# Patient Record
Sex: Female | Born: 1985 | Race: White | Hispanic: No | Marital: Single | Smoking: Former smoker
Health system: Southern US, Community
[De-identification: ages and names within clinical notes are randomized; demographics above are authoritative.]

## PROBLEM LIST (undated history)

## (undated) ENCOUNTER — Ambulatory Visit

## (undated) DIAGNOSIS — I219 Acute myocardial infarction, unspecified: Secondary | ICD-10-CM

## (undated) DIAGNOSIS — I2510 Atherosclerotic heart disease of native coronary artery without angina pectoris: Secondary | ICD-10-CM

## (undated) DIAGNOSIS — I10 Essential (primary) hypertension: Secondary | ICD-10-CM

## (undated) DIAGNOSIS — E119 Type 2 diabetes mellitus without complications: Secondary | ICD-10-CM

## (undated) DIAGNOSIS — E785 Hyperlipidemia, unspecified: Secondary | ICD-10-CM

## (undated) HISTORY — DX: Acute myocardial infarction, unspecified: I21.9

## (undated) HISTORY — PX: CORONARY STENT PLACEMENT: SHX1402

---

## 2004-02-10 ENCOUNTER — Emergency Department (HOSPITAL_COMMUNITY): Admission: EM | Admit: 2004-02-10 | Discharge: 2004-02-11 | Payer: Self-pay | Admitting: *Deleted

## 2006-08-11 ENCOUNTER — Emergency Department (HOSPITAL_COMMUNITY): Admission: EM | Admit: 2006-08-11 | Discharge: 2006-08-11 | Payer: Self-pay | Admitting: Emergency Medicine

## 2006-11-20 ENCOUNTER — Emergency Department (HOSPITAL_COMMUNITY): Admission: EM | Admit: 2006-11-20 | Discharge: 2006-11-21 | Payer: Self-pay | Admitting: Emergency Medicine

## 2007-09-09 ENCOUNTER — Emergency Department (HOSPITAL_COMMUNITY): Admission: EM | Admit: 2007-09-09 | Discharge: 2007-09-09 | Payer: Self-pay | Admitting: Emergency Medicine

## 2008-02-03 ENCOUNTER — Emergency Department (HOSPITAL_COMMUNITY): Admission: EM | Admit: 2008-02-03 | Discharge: 2008-02-03 | Payer: Self-pay | Admitting: Emergency Medicine

## 2008-08-25 ENCOUNTER — Emergency Department (HOSPITAL_COMMUNITY): Admission: EM | Admit: 2008-08-25 | Discharge: 2008-08-25 | Payer: Self-pay | Admitting: Emergency Medicine

## 2009-01-28 ENCOUNTER — Emergency Department (HOSPITAL_COMMUNITY): Admission: EM | Admit: 2009-01-28 | Discharge: 2009-01-29 | Payer: Self-pay | Admitting: Emergency Medicine

## 2009-03-09 ENCOUNTER — Emergency Department (HOSPITAL_COMMUNITY): Admission: EM | Admit: 2009-03-09 | Discharge: 2009-03-09 | Payer: Self-pay | Admitting: Emergency Medicine

## 2009-05-14 ENCOUNTER — Emergency Department (HOSPITAL_COMMUNITY): Admission: EM | Admit: 2009-05-14 | Discharge: 2009-05-14 | Payer: Self-pay | Admitting: Emergency Medicine

## 2010-11-26 LAB — DIFFERENTIAL
Basophils Relative: 0 % (ref 0–1)
Lymphs Abs: 3 10*3/uL (ref 0.7–4.0)
Monocytes Relative: 5 % (ref 3–12)
Neutro Abs: 7.7 10*3/uL (ref 1.7–7.7)
Neutrophils Relative %: 67 % (ref 43–77)

## 2010-11-26 LAB — CBC
HCT: 44.4 % (ref 36.0–46.0)
Hemoglobin: 15 g/dL (ref 12.0–15.0)
MCHC: 33.8 g/dL (ref 30.0–36.0)
RDW: 13.3 % (ref 11.5–15.5)

## 2010-11-26 LAB — COMPREHENSIVE METABOLIC PANEL
BUN: 7 mg/dL (ref 6–23)
Calcium: 9 mg/dL (ref 8.4–10.5)
Glucose, Bld: 97 mg/dL (ref 70–99)
Total Protein: 7.2 g/dL (ref 6.0–8.3)

## 2011-05-19 LAB — URINALYSIS, ROUTINE W REFLEX MICROSCOPIC
Bilirubin Urine: NEGATIVE
Ketones, ur: NEGATIVE
Nitrite: NEGATIVE
Protein, ur: NEGATIVE
Urobilinogen, UA: 0.2

## 2011-05-19 LAB — COMPREHENSIVE METABOLIC PANEL
ALT: 15
Alkaline Phosphatase: 100
CO2: 23
Calcium: 9.2
Chloride: 110
GFR calc non Af Amer: >60
Glucose, Bld: 92
Sodium: 137
Total Bilirubin: 0.6

## 2011-05-19 LAB — PREGNANCY, URINE: Preg Test, Ur: NEGATIVE

## 2011-05-19 LAB — CBC
Hemoglobin: 14.7
MCHC: 33.9
RBC: 5.05

## 2011-05-19 LAB — URINE CULTURE

## 2011-05-19 LAB — DIFFERENTIAL
Basophils Absolute: 0
Basophils Relative: 0
Eosinophils Absolute: 0.1
Lymphs Abs: 2.7
Neutrophils Relative %: 69

## 2011-05-19 LAB — URINE MICROSCOPIC-ADD ON

## 2011-05-19 LAB — LIPASE, BLOOD: Lipase: 29

## 2012-11-12 DIAGNOSIS — E669 Obesity, unspecified: Secondary | ICD-10-CM | POA: Insufficient documentation

## 2013-09-30 DIAGNOSIS — Z720 Tobacco use: Secondary | ICD-10-CM | POA: Insufficient documentation

## 2015-03-10 DIAGNOSIS — N912 Amenorrhea, unspecified: Secondary | ICD-10-CM | POA: Insufficient documentation

## 2015-07-28 DIAGNOSIS — I10 Essential (primary) hypertension: Secondary | ICD-10-CM | POA: Insufficient documentation

## 2015-08-22 ENCOUNTER — Emergency Department (HOSPITAL_COMMUNITY)
Admission: EM | Admit: 2015-08-22 | Discharge: 2015-08-22 | Disposition: A | Discharge status: Home / Self Care | Payer: 59 | Attending: Emergency Medicine | Admitting: Emergency Medicine

## 2015-08-22 ENCOUNTER — Encounter (HOSPITAL_COMMUNITY): Payer: Self-pay | Admitting: Emergency Medicine

## 2015-08-22 DIAGNOSIS — Y9289 Other specified places as the place of occurrence of the external cause: Secondary | ICD-10-CM | POA: Insufficient documentation

## 2015-08-22 DIAGNOSIS — Z9861 Coronary angioplasty status: Secondary | ICD-10-CM | POA: Insufficient documentation

## 2015-08-22 DIAGNOSIS — Y998 Other external cause status: Secondary | ICD-10-CM | POA: Insufficient documentation

## 2015-08-22 DIAGNOSIS — H5711 Ocular pain, right eye: Secondary | ICD-10-CM | POA: Diagnosis present

## 2015-08-22 DIAGNOSIS — X58XXXA Exposure to other specified factors, initial encounter: Secondary | ICD-10-CM | POA: Insufficient documentation

## 2015-08-22 DIAGNOSIS — F17210 Nicotine dependence, cigarettes, uncomplicated: Secondary | ICD-10-CM | POA: Insufficient documentation

## 2015-08-22 DIAGNOSIS — I10 Essential (primary) hypertension: Secondary | ICD-10-CM | POA: Diagnosis not present

## 2015-08-22 DIAGNOSIS — S0501XA Injury of conjunctiva and corneal abrasion without foreign body, right eye, initial encounter: Secondary | ICD-10-CM | POA: Diagnosis not present

## 2015-08-22 DIAGNOSIS — Y9389 Activity, other specified: Secondary | ICD-10-CM | POA: Insufficient documentation

## 2015-08-22 HISTORY — DX: Essential (primary) hypertension: I10

## 2015-08-22 MED ORDER — TOBRAMYCIN 0.3 % OP SOLN: 2.0000 [drp] | OPHTHALMIC | Status: DC

## 2015-08-22 MED ORDER — HYDROCODONE-ACETAMINOPHEN 5-325 MG PO TABS: 2.0000 | ORAL_TABLET | ORAL | Status: DC | PRN

## 2015-08-22 NOTE — ED Notes (Signed)
Pt states that she has been having right eye redness and tearing for the past 3 days.

## 2015-08-22 NOTE — ED Provider Notes (Signed)
CSN: [id number]     Arrival date & time 08/22/15  1042 History   First MD Initiated Contact with Patient 08/22/15 1100     Chief Complaint  Patient presents with  . Eye Pain     (Consider location/radiation/quality/duration/timing/severity/associated sxs/prior Treatment) Patient is a 29 y.o. female presenting with eye problem. The history is provided by the patient. No language interpreter was used.  Eye Problem Location:  R eye Quality:  Aching Severity:  Moderate Onset quality:  Gradual Duration:  5 days Timing:  Constant Progression:  Worsening Chronicity:  New Context: not chemical exposure, not contact lens problem, not direct trauma, not foreign body and not scratch   Relieved by:  Nothing Worsened by:  Nothing tried Ineffective treatments:  None tried Associated symptoms: foreign body sensation   Associated symptoms: no blurred vision and no swelling   Pt seen at Urgent care Tuesday and had normal evaluation.  Pt reports increased redness and irritation.   Past Medical History  Diagnosis Date  . Hypertension    Past Surgical History  Procedure Laterality Date  . Coronary stent placement     History reviewed. No pertinent family history. Social History  Substance Use Topics  . Smoking status: Current Every Day Smoker -- 1.00 packs/day    Types: Cigarettes  . Smokeless tobacco: None  . Alcohol Use: No   OB History    No data available     Review of Systems  Eyes: Negative for blurred vision.  All other systems reviewed and are negative.     Allergies  Codeine  Home Medications   Prior to Admission medications   Not on File   BP 175/114 mmHg  Pulse 104  Temp(Src) 98.7 F (37.1 C) (Oral)  Resp 18  Ht 5\' 5"  (1.651 m)  Wt 133.811 kg  BMI 49.09 kg/m2  SpO2 100% Physical Exam  Constitutional: She appears well-developed and well-nourished.  HENT:  Head: Atraumatic.  Eyes: EOM are normal. Pupils are equal, round, and reactive to light.   Injected right conjunctiva, tearing during exam  Fluro,  Tiny pinpoint area of uptake,  Lids swabbed no foreign body,  No obvious eye foreign body,  Slit lamp , no ulcer, no lesions,   Cardiovascular: Normal rate.   Pulmonary/Chest: Effort normal.  Neurological: She is alert.  Skin: Skin is warm.  Psychiatric: She has a normal mood and affect.  Nursing note and vitals reviewed.   ED Course  Procedures (including critical care time) Labs Review Labs Reviewed - No data to display  Imaging Review No results found. I have personally reviewed and evaluated these images and lab results as part of my medical decision-making.   EKG Interpretation None      MDM I counseled pt on findings.  She has a tiny discolored area with a pinpoint area of uptake.  I am unsure if this is a resolving ulcer.corneal abrasion or if pt has a tiny foreign body.  I swabbed areas  under eyelids, no foreign body.  I will treat with Tobrex.  Pt advised to call Opthomology on Monday to be seen   Final diagnoses:  Corneal abrasion, right, initial encounter    Meds ordered this encounter  Medications  . citalopram (CELEXA) 10 MG tablet    Sig: Take 10 mg by mouth daily.  Marland Kitchen. spironolactone (ALDACTONE) 25 MG tablet    Sig: Take 25 mg by mouth daily.  Marland Kitchen. labetalol (NORMODYNE) 300 MG tablet    Sig: Take  300 mg by mouth 3 (three) times daily.  Marland Kitchen atorvastatin (LIPITOR) 80 MG tablet    Sig: Take 80 mg by mouth daily.  Marland Kitchen amLODipine (NORVASC) 10 MG tablet    Sig: Take 10 mg by mouth daily.  . cholecalciferol (VITAMIN D) 1000 units tablet    Sig: Take 1,000 Units by mouth daily.  Marland Kitchen lisinopril-hydrochlorothiazide (PRINZIDE,ZESTORETIC) 20-25 MG tablet    Sig: Take 1 tablet by mouth daily.  Marland Kitchen acetaminophen (TYLENOL) 500 MG tablet    Sig: Take 500 mg by mouth every 6 (six) hours as needed for mild pain.  Marland Kitchen tobramycin (TOBREX) 0.3 % ophthalmic solution    Sig: Place 2 drops into the right eye every 4 (four) hours.     Dispense:  5 mL    Refill:  0    Order Specific Question:  Supervising Provider    Answer:  MILLER, BRIAN [[id number]]  . HYDROcodone-acetaminophen (NORCO/VICODIN) 5-325 MG tablet    Sig: Take 2 tablets by mouth every 4 (four) hours as needed.    Dispense:  10 tablet    Refill:  0    Order Specific Question:  Supervising Provider    Answer:  Eber Hong [[id number]]      Lonia Skinner [place], PA-C 08/22/15 1325  Benjiman Core, MD 08/22/15 [phone]

## 2015-08-22 NOTE — Discharge Instructions (Signed)

## 2017-03-10 ENCOUNTER — Encounter: Payer: Self-pay | Admitting: Obstetrics & Gynecology

## 2017-03-10 ENCOUNTER — Ambulatory Visit (INDEPENDENT_AMBULATORY_CARE_PROVIDER_SITE_OTHER): Payer: BLUE CROSS/BLUE SHIELD | Admitting: Obstetrics & Gynecology

## 2017-03-10 VITALS — BP 202/130 | HR 103 | Ht 65.5 in | Wt 287.0 lb

## 2017-03-10 DIAGNOSIS — N970 Female infertility associated with anovulation: Secondary | ICD-10-CM | POA: Diagnosis not present

## 2017-03-10 DIAGNOSIS — E282 Polycystic ovarian syndrome: Secondary | ICD-10-CM

## 2017-03-10 DIAGNOSIS — N762 Acute vulvitis: Secondary | ICD-10-CM

## 2017-03-10 NOTE — Progress Notes (Signed)
Chief Complaint  Patient presents with  . pain with using tampons and during sex    Blood pressure (!) 202/130, pulse (!) 103, height 5' 5.5" (1.664 m), weight 287 lb (130.2 kg), last menstrual period 02/10/2017.  31 y.o. G0P0000 Patient's last menstrual period was 02/10/2017 (approximate). The current method of family planning is none.  Outpatient Encounter Medications as of 03/10/2017  Medication Sig  . [DISCONTINUED] acetaminophen (TYLENOL) 500 MG tablet Take 500 mg by mouth every 6 (six) hours as needed for mild pain.  . [DISCONTINUED] amLODipine (NORVASC) 10 MG tablet Take 10 mg by mouth daily.  . [DISCONTINUED] atorvastatin (LIPITOR) 80 MG tablet Take 80 mg by mouth daily.  . [DISCONTINUED] cholecalciferol (VITAMIN D) 1000 units tablet Take 1,000 Units by mouth daily.  . [DISCONTINUED] citalopram (CELEXA) 10 MG tablet Take 10 mg by mouth daily.  . [DISCONTINUED] HYDROcodone-acetaminophen (NORCO/VICODIN) 5-325 MG tablet Take 2 tablets by mouth every 4 (four) hours as needed.  . [DISCONTINUED] labetalol (NORMODYNE) 300 MG tablet Take 300 mg by mouth 3 (three) times daily.  . [DISCONTINUED] lisinopril-hydrochlorothiazide (PRINZIDE,ZESTORETIC) 20-25 MG tablet Take 1 tablet by mouth daily.  . [DISCONTINUED] spironolactone (ALDACTONE) 25 MG tablet Take 25 mg by mouth daily.  . [DISCONTINUED] tobramycin (TOBREX) 0.3 % ophthalmic solution Place 2 drops into the right eye every 4 (four) hours.   No facility-administered encounter medications on file as of 03/10/2017.     Subjective Pt with several month or longer history of pain inserting tampons and with sex, deep and minimal insertional Feels like burning, tearing, uses lubrication without success sometimes feels like hitting something as well No bleeding associated  Objective Blood pressure (!) 202/130, pulse (!) 103, height 5' 5.5" (1.664 m), weight 287 lb (130.2 kg), last menstrual period 02/10/2017.   General WDWN  female NAD Vulva:  normal appearing vulva with no masses, tenderness or lesions, negative Q tip test Vagina:  normal mucosa Cervix:  no cervical motion tenderness and no lesions Uterus:  normal size, contour, position, consistency, mobility, non-tender Adnexa: ovaries:,     Pertinent ROS No burning with urination, frequency or urgency No nausea, vomiting or diarrhea Nor fever chills or other constitutional symptoms   Labs or studies none    Impression Diagnoses this Encounter::   ICD-10-CM   1. Acute vulvitis N76.2   2. PCOS (polycystic ovarian syndrome) E28.2   3. Anovulation N97.0     Established relevant diagnosis(es):   Plan/Recommendations: No orders of the defined types were placed in this encounter.   Labs or Scans Ordered: No orders of the defined types were placed in this encounter.   Management:: Consider topical anesthetic vs steroids,vestibulectomy would not be of benefit  Follow up Return if symptoms worsen or fail to improve.        All questions were answered.  Past Medical History:  Diagnosis Date  . 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 Hypertension     Past Surgical History:  Procedure Laterality Date  . CORONARY STENT PLACEMENT      OB History    Gravida Para Term Preterm AB Living   0 0 0 0 0 0   SAB TAB Ectopic Multiple Live Births   0 0 0 0 0      Allergies  Allergen Reactions  . Codeine Nausea And Vomiting  . Hydrocodone Nausea Only    Social History   Socioeconomic History  . Marital status: Single  Spouse name: None  . Number of children: None  . Years of education: None  . Highest education level: None  Social Needs  . Financial resource strain: None  . Food insecurity - worry: None  . Food insecurity - inability: None  . Transportation needs - medical: None  . Transportation needs - non-medical: None  Occupational History  . None  Tobacco Use  . Smoking status: Current Every Day Smoker     Packs/day: 1.00    Years: 15.00    Pack years: 15.00    Types: Cigarettes  . Smokeless tobacco: Never Used  Substance and Sexual Activity  . Alcohol use: No  . Drug use: No  . Sexual activity: Yes    Birth control/protection: None  Other Topics Concern  . None  Social History Narrative  . None    Family History  Problem Relation Age of Onset  . Diabetes Paternal Grandfather   . Other Paternal Grandmother        low blood sugar  . Other Maternal Grandmother        gallstones-septic  . Other Maternal Grandfather        heart issues  . Hypertension Father   . Heart disease Father   . Diabetes Father   . Hypertension Mother   . Cancer Mother        ovarian  . Diabetes Brother        borderline

## 2017-07-19 ENCOUNTER — Encounter: Payer: Self-pay | Admitting: Obstetrics & Gynecology

## 2017-07-24 ENCOUNTER — Ambulatory Visit (INDEPENDENT_AMBULATORY_CARE_PROVIDER_SITE_OTHER): Payer: Self-pay | Admitting: Obstetrics & Gynecology

## 2017-07-24 ENCOUNTER — Encounter: Payer: Self-pay | Admitting: Obstetrics & Gynecology

## 2017-07-24 VITALS — BP 190/110 | HR 91 | Ht 65.0 in | Wt 275.0 lb

## 2017-07-24 DIAGNOSIS — F329 Major depressive disorder, single episode, unspecified: Secondary | ICD-10-CM

## 2017-07-24 MED ORDER — FLUOXETINE HCL 20 MG PO CAPS: 20.0000 mg | ORAL_CAPSULE | Freq: Every day | ORAL | 3 refills | Status: DC

## 2017-07-24 MED ORDER — ZOLPIDEM TARTRATE 10 MG PO TABS: 10.0000 mg | ORAL_TABLET | Freq: Every evening | ORAL | 3 refills | Status: DC | PRN

## 2017-07-24 MED ORDER — ALPRAZOLAM 0.5 MG PO TABS: 0.5000 mg | ORAL_TABLET | Freq: Three times a day (TID) | ORAL | 3 refills | Status: DC | PRN

## 2017-07-24 NOTE — Progress Notes (Signed)
Subjective:   Chelsea Petty is an 31 y.o. female who presents for evaluation and treatment of depressive symptoms.  Onset approximately 3 months ago, gradually worsening since that time.  Current symptoms include depressed mood, anhedonia, insomnia, psychomotor agitation, fatigue, anxiety,.  Current treatment for depression:None Sleep problems: Severe   Early awakening:Moderate   Energy: Poor Motivation: Poor Concentration: Poor Rumination/worrying: Marked Memory: Poor Tearfulness: Moderate  Anxiety: Moderate  Panic: Absent  Overall Mood: Moderately worse  Hopelessness: Moderate Suicidal ideation: Absent  Other/Psychosocial Stressors: Lost husband in a motorcycle accident August 2018 Family history positive for depression in the patient's mother.  Previous treatment modalities employed include None.  Past episodes of depression:x 1 Organic causes of depression present: None.  Review of Systems Pertinent items are noted in HPI.   Objective:   Mental Status Examination: Posture and motor behavior: Appropriate Dress, grooming, personal hygiene: Appropriate Facial expression: Appropriate Speech: Appropriate Mood: Appropriate Coherency and relevance of thought: Appropriate Thought content: Appropriate Perceptions: Appropriate Orientation:Appropriate Attention and concentration: Appropriate Memory: : Appropriate Information: Negative Vocabulary: Appropriate Abstract reasoning: Appropriate Judgment: Appropriate    Assessment:   Experiencing the following symptoms of depression most of the day nearly every day for more than two consecutive weeks: depressed mood, loss of interests/pleasure, change in sleep, loss of energy, trouble concentrating  Depressive Disorder:Reactive Dpression Hypertension, pt forgot to take her BP meds Suicide Risk Assessment:  Suicidal intent: none Suicidal plan: none Access to means for suicide: n/a Lethality of means for suicide:   Prior suicide attempts: none Recent exposure to suicide:   Plan:   No diagnosis found.  Reviewed concept of depression as biochemical imbalance of neurotransmitters and rationale for treatment. Instructed patient to contact office or on-call physician promptly should condition worsen or any new symptoms appear and provided on-call telephone numbers.       Face to face time:  15 minutes  Greater than 50% of the visit time was spent in counseling and coordination of care with the patient.  The summary and outline of the counseling and care coordination is summarized in the note above.   All questions were answered.

## 2017-08-21 ENCOUNTER — Encounter: Payer: Self-pay | Admitting: *Deleted

## 2017-08-24 ENCOUNTER — Encounter: Payer: Self-pay | Admitting: Obstetrics & Gynecology

## 2017-08-24 ENCOUNTER — Ambulatory Visit: Payer: Self-pay | Admitting: Obstetrics & Gynecology

## 2018-05-14 ENCOUNTER — Emergency Department (HOSPITAL_COMMUNITY)
Admission: EM | Admit: 2018-05-14 | Discharge: 2018-05-14 | Disposition: A | Discharge status: Home / Self Care | Payer: Self-pay | Attending: Emergency Medicine | Admitting: Emergency Medicine

## 2018-05-14 ENCOUNTER — Encounter (HOSPITAL_COMMUNITY): Payer: Self-pay | Admitting: Emergency Medicine

## 2018-05-14 DIAGNOSIS — Z955 Presence of coronary angioplasty implant and graft: Secondary | ICD-10-CM | POA: Insufficient documentation

## 2018-05-14 DIAGNOSIS — I252 Old myocardial infarction: Secondary | ICD-10-CM | POA: Insufficient documentation

## 2018-05-14 DIAGNOSIS — J069 Acute upper respiratory infection, unspecified: Secondary | ICD-10-CM | POA: Insufficient documentation

## 2018-05-14 DIAGNOSIS — I10 Essential (primary) hypertension: Secondary | ICD-10-CM | POA: Insufficient documentation

## 2018-05-14 DIAGNOSIS — W57XXXA Bitten or stung by nonvenomous insect and other nonvenomous arthropods, initial encounter: Secondary | ICD-10-CM | POA: Insufficient documentation

## 2018-05-14 DIAGNOSIS — F17210 Nicotine dependence, cigarettes, uncomplicated: Secondary | ICD-10-CM | POA: Insufficient documentation

## 2018-05-14 DIAGNOSIS — Z79899 Other long term (current) drug therapy: Secondary | ICD-10-CM | POA: Insufficient documentation

## 2018-05-14 DIAGNOSIS — Z7982 Long term (current) use of aspirin: Secondary | ICD-10-CM | POA: Insufficient documentation

## 2018-05-14 MED ORDER — LABETALOL HCL 200 MG PO TABS
300.0000 mg | ORAL_TABLET | Freq: Once | ORAL | Status: AC
  Administered 2018-05-14: 300 mg via ORAL
  Filled 2018-05-14: qty 2

## 2018-05-14 MED ORDER — TRIAMCINOLONE ACETONIDE 0.1 % EX CREA: 1.0000 "application " | TOPICAL_CREAM | Freq: Two times a day (BID) | CUTANEOUS | 0 refills | Status: DC

## 2018-05-14 NOTE — Discharge Instructions (Addendum)
You were evaluated today for an upper respiratory infection.  Is most likely viral in nature and will run its course.  If you still have symptoms past 10 days would suggest following up with your primary care provider if you need antibiotic therapy at that point.  Your rash resembles bedbug bites.  Have prescribed you triamcinolone cream for this rash to help with the itching.  You may also take oral Benadryl as needed for your symptoms.    Your blood pressure waited during today's visit.  This is most likely because you had not taken her blood pressure medicine.  Please follow-up with your primary care provider for reevaluation.  Return to the ED if you develop any symptoms such as chest pain, shortness of breath, headache, blurred vision.  Follow-up with your primary care provider 1 week called.  Return to the ED with any new or worsening symptoms such as: Get help right away if: You have very bad or constant: Headache. Ear pain. Pain in your forehead, behind your eyes, and over your cheekbones (sinus pain). Chest pain. You have long-lasting (chronic) lung disease and any of the following: Wheezing. Long-lasting cough. Coughing up blood. A change in your usual mucus. You have a stiff neck. You have changes in your: Vision. Hearing. Thinking. Mood.

## 2018-05-14 NOTE — ED Provider Notes (Signed)
[facility]GENCY DEPARTMENT Provider Note   CSN: [id number] Arrival date & time: 05/14/18  1421   History   Chief Complaint Chief Complaint  Patient presents with  . Rash  . Cough    HPI [name] is a 32 y.o. female with a past medical history significant for hypertension, hx MI who presents for evaluation of sinus congestion and cough x2 days with rash to upper arms.  Patient's states she developed sinus pressure and pain with nasal congestion on Friday evening.  States she has had a nonproductive cough.  Cough is worse with laying down, states it feels like her sinus drainage tripped on the back of her throat cause her to cough.  Denies fever, chills, blurred vision, chest pain, shortness of breath, abdominal pain, nausea, vomiting, diarrhea, constipation, lower extremity leg swelling.  Per patient she states she has been taking Alka-Seltzer plus for cold for symptoms.  She states she has also not taken her blood pressure medicine in 1 day.  She states she supposed to be taking labetalol 300 mg 3 times daily and Lasix.  Patient admits to rash which appeared after work on upper extremities.  States this has been itchy.  Patient states she thought she was a bit by mosquitoes and has been putting Benadryl lotion on this rash.  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  HPI  Past Medical History:  Diagnosis Date  . 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 Hypertension     There are no active problems to display for this patient.   Past Surgical History:  Procedure Laterality Date  . CORONARY STENT PLACEMENT       OB History    Gravida  0   Para  0   Term  0   Preterm  0   AB  0   Living  0     SAB  0   TAB  0   Ectopic  0   Multiple  0   Live Births  0            Home Medications    Prior to Admission medications   Medication Sig Start Date End Date Taking? Authorizing Provider    acetaminophen (TYLENOL) 500 MG tablet Take 1,000 mg by mouth as needed.     [provider]  ALPRAZolam Prudy Feeler(XANAX) 0.5 MG tablet Take 1 tablet (0.5 mg total) by mouth 3 (three) times daily as needed for anxiety. 07/24/17   Lazaro ArmsEure, Luther H, MD  aspirin EC 81 MG tablet Take 81 mg by mouth daily.     [provider]  FLUoxetine (PROZAC) 20 MG capsule Take 1 capsule (20 mg total) by mouth daily. 07/24/17   Lazaro ArmsEure, Luther H, MD  furosemide (LASIX) 20 MG tablet Take 20 mg by mouth daily.  03/31/17 03/31/18  [provider]  labetalol (NORMODYNE) 300 MG tablet Take 300 mg by mouth 3 (three) times daily.  07/10/17   [provider]  triamcinolone cream (KENALOG) 0.1 % Apply 1 application topically 2 (two) times daily. 05/14/18   Deforrest Bogle A, PA-C  zolpidem (AMBIEN) 10 MG tablet Take 1 tablet (10 mg total) by mouth at bedtime as needed for sleep. 07/24/17 08/23/17  Lazaro ArmsEure, Luther H, MD    Family History Family History  Problem Relation Age of Onset  . Diabetes Paternal Grandfather   . Other Paternal Grandmother  low blood sugar  . Other Maternal Grandmother        gallstones-septic  . Other Maternal Grandfather        heart issues  . Hypertension Father   . Heart disease Father   . Diabetes Father   . Hypertension Mother   . Cancer Mother        ovarian  . Diabetes Brother        borderline    Social History Social History   Tobacco Use  . Smoking status: Current Every Day Smoker    Packs/day: 0.50    Years: 15.00    Pack years: 7.50    Types: Cigarettes  . Smokeless tobacco: Never Used  Substance Use Topics  . Alcohol use: No  . Drug use: No     Allergies   Codeine and Hydrocodone   Review of Systems Review of Systems  Constitutional: Negative for activity change, appetite change, chills, diaphoresis, fatigue and fever.  HENT: Positive for congestion, postnasal drip, rhinorrhea, sinus pressure and sinus pain. Negative for dental problem,  drooling, ear discharge, facial swelling, hearing loss, sneezing, sore throat, tinnitus, trouble swallowing and voice change.   Respiratory: Negative.   Cardiovascular: Negative.   Gastrointestinal: Negative.   Skin: Positive for rash.     Physical Exam Updated Vital Signs BP (!) 174/122   Pulse 83   Temp 98.2 F (36.8 C) (Oral)   Resp 16   Ht 5\' 5"  (1.651 m)   Wt 127 kg   LMP 05/14/2018   SpO2 97%   BMI 46.59 kg/m   Physical Exam  Constitutional: She appears well-developed and well-nourished. No distress.  HENT:  Head: Normocephalic and atraumatic.  Mouth/Throat: Oropharynx is clear and moist.  Mild tenderness to palpation over maxillary sinus.   Eyes: Pupils are equal, round, and reactive to light.  Neck: Normal range of motion.  Cardiovascular: Normal rate, regular rhythm, normal heart sounds and intact distal pulses. Exam reveals no gallop and no friction rub.  No murmur heard. Pulmonary/Chest: Effort normal and breath sounds normal. No stridor. No respiratory distress. She has no wheezes. She has no rales. She exhibits no tenderness.  Abdominal: Soft. Bowel sounds are normal. She exhibits no distension and no mass. There is no tenderness. There is no rebound and no guarding. No hernia.  Musculoskeletal: Normal range of motion.  Neurological: She is alert.  Skin: Skin is warm and dry. She is not diaphoretic.  Multiple areas papular areas of erythema to bilateral upper extremities consistent with bedbug bites.  No target lesions, no bulla, no urticaria.   Psychiatric: She has a normal mood and affect.  Nursing note and vitals reviewed.    ED Treatments / Results  Labs (all labs ordered are listed, but only abnormal results are displayed) Labs Reviewed - No data to display  EKG None  Radiology No results found.  Procedures Procedures (including critical care time)  Medications Ordered in ED Medications  labetalol (NORMODYNE) tablet 300 mg (300 mg Oral  Given 05/14/18 1507)     Initial Impression / Assessment and Plan / ED Course  I have reviewed the triage vital signs and the nursing notes vitals past medical history.  Pertinent labs & imaging results that were available during my care of the patient were reviewed by me and considered in my medical decision making (see chart for details).  Patient presents for evaluation of sinus congestion and cough as well as rash.  Feel her sinus congestion cough  is likely a viral upper respiratory infection.  She is afebrile, nonseptic, non-ill-appearing.  Discussed with patient symptomatic treatment.  Discussed not taking over-the-counter decongestants such as Alka-Seltzer which she has been taking because of her elevated blood pressure.  Recommended Coricidin HBP.   Patient's rash consistent with bug bites.  Will DC home with triamcinolone cream and she can also take oral Benadryl for pruritus.  Patient's blood pressure has been elevated during her visit.  Per patient she has not taken her blood pressure medicine in the last 24 hours as well as been taking over-the-counter decongestants.  She is is asymptomatic with no chest pain, shortness of breath, headache, blurred vision.  Did give her 1 dose of her home blood pressure medicine, labetalol with reduction in her blood pressure.  Discussed with patient that she needs follow-up with her primary care provider for reevaluation of her blood pressure.  Discussed strict return precautions with patient.  Patient voiced understanding.    Final Clinical Impressions(s) / ED Diagnoses   Final diagnoses:  Viral upper respiratory tract infection  Bedbug bite, initial encounter    ED Discharge Orders         Ordered    triamcinolone cream (KENALOG) 0.1 %  2 times daily     05/14/18 1551           Staphany Ditton A, PA-C 05/14/18 1659    Blane Ohara, MD 05/18/18 1722

## 2018-05-14 NOTE — ED Notes (Signed)
Patient given discharge instruction, verbalized understand. Patient ambulatory out of the department.  

## 2018-05-14 NOTE — ED Notes (Signed)
Pt did not take her BP medication today and has been taking OTC cold medication

## 2018-05-14 NOTE — ED Triage Notes (Signed)
Pt states since Friday she has had a rash on arms and back with a nonproductive cough.

## 2018-08-28 ENCOUNTER — Encounter (HOSPITAL_COMMUNITY): Payer: Self-pay | Admitting: Emergency Medicine

## 2018-08-28 ENCOUNTER — Other Ambulatory Visit: Payer: Self-pay

## 2018-08-28 ENCOUNTER — Emergency Department (HOSPITAL_COMMUNITY)
Admission: EM | Admit: 2018-08-28 | Discharge: 2018-08-28 | Disposition: A | Discharge status: Home / Self Care | Payer: Self-pay | Attending: Emergency Medicine | Admitting: Emergency Medicine

## 2018-08-28 DIAGNOSIS — I10 Essential (primary) hypertension: Secondary | ICD-10-CM | POA: Insufficient documentation

## 2018-08-28 DIAGNOSIS — Z7982 Long term (current) use of aspirin: Secondary | ICD-10-CM | POA: Insufficient documentation

## 2018-08-28 DIAGNOSIS — J111 Influenza due to unidentified influenza virus with other respiratory manifestations: Secondary | ICD-10-CM | POA: Insufficient documentation

## 2018-08-28 DIAGNOSIS — F17210 Nicotine dependence, cigarettes, uncomplicated: Secondary | ICD-10-CM | POA: Insufficient documentation

## 2018-08-28 DIAGNOSIS — Z79899 Other long term (current) drug therapy: Secondary | ICD-10-CM | POA: Insufficient documentation

## 2018-08-28 MED ORDER — OSELTAMIVIR PHOSPHATE 75 MG PO CAPS
75.0000 mg | ORAL_CAPSULE | Freq: Once | ORAL | Status: AC
  Administered 2018-08-28: 75 mg via ORAL
  Filled 2018-08-28: qty 1

## 2018-08-28 MED ORDER — LORATADINE-PSEUDOEPHEDRINE ER 5-120 MG PO TB12: 1.0000 | ORAL_TABLET | Freq: Two times a day (BID) | ORAL | 0 refills | Status: DC

## 2018-08-28 MED ORDER — IBUPROFEN 800 MG PO TABS
800.0000 mg | ORAL_TABLET | Freq: Once | ORAL | Status: AC
  Administered 2018-08-28: 800 mg via ORAL
  Filled 2018-08-28: qty 1

## 2018-08-28 MED ORDER — OSELTAMIVIR PHOSPHATE 75 MG PO CAPS: 75.0000 mg | ORAL_CAPSULE | Freq: Two times a day (BID) | ORAL | 0 refills | Status: DC

## 2018-08-28 MED ORDER — PSEUDOEPHEDRINE HCL 60 MG PO TABS
60.0000 mg | ORAL_TABLET | Freq: Once | ORAL | Status: AC
  Administered 2018-08-28: 60 mg via ORAL
  Filled 2018-08-28: qty 1

## 2018-08-28 NOTE — Discharge Instructions (Addendum)
Your examination is consistent with influenza.  Please wash hands frequently.  Please increase fluids.  Use Tylenol every 4 hours or ibuprofen every 6 hours for fever, and/or aching.  Use a mask until symptoms have completely resolved.  Please keep your distance from others.  Use Tamiflu daily until all taken.  Please rest as much as possible.  Please see your primary physician or return to the emergency department if any changes in your condition, problems, or concerns.  Your blood pressure is elevated.  Please see your primary physician, or see the physicians at the [facility] clinic to establish a primary physician and for follow-up of your blood pressure.

## 2018-08-28 NOTE — ED Triage Notes (Signed)
Patient states fever and body aches that started 2 days ago. NAD.

## 2018-08-28 NOTE — ED Provider Notes (Signed)
Planes of non-productive cough, companied by diffuse myalgias and headache.  She denies any shortness of breath.  Other associated symptoms include subjective fever.  She treated herself with Tylenol yesterday with improvement of symptoms.  On exam alert and in no distress lungs clear to auscultation heart tachycardic regular rhythm abdomen obese, nontender all 4 extremities are redness swelling or tenderness neurovascular intact. I counseled patient for 5 minutes on smoking cessation   Doug Sou, MD 08/28/18 1224

## 2018-08-28 NOTE — ED Notes (Signed)
Pt states achy all over. Pain started yesterday. Cough started 2 days ago

## 2018-08-28 NOTE — ED Provider Notes (Signed)
[facility] EMERGENCY DEPARTMENT Provider Note   CSN: [id number] Arrival date & time: 08/28/18  1017     History   Chief Complaint Chief Complaint  Patient presents with  . Fever    HPI Chelsea Petty is a 33 y.o. female.  The history is provided by the patient.  Fever  Temp source:  Subjective Severity:  Moderate Onset quality:  Gradual Duration:  2 days Timing:  Constant Progression:  Worsening Chronicity:  New Relieved by:  Nothing Worsened by:  Nothing Associated symptoms: chills, congestion, cough, headaches and myalgias   Associated symptoms: no chest pain, no confusion and no dysuria   Risk factors: sick contacts   Risk factors: no contaminated food and no recent travel     Past Medical History:  Diagnosis Date  . Heart attack [facility])    has 2 stents  . Hypertension     There are no active problems to display for this patient.   Past Surgical History:  Procedure Laterality Date  . CORONARY STENT PLACEMENT       OB History    Gravida  0   Para  0   Term  0   Preterm  0   AB  0   Living  0     SAB  0   TAB  0   Ectopic  0   Multiple  0   Live Births  0            Home Medications    Prior to Admission medications   Medication Sig Start Date End Date Taking? Authorizing Provider  acetaminophen (TYLENOL) 500 MG tablet Take 1,000 mg by mouth as needed.    Yes [provider]  ALPRAZolam (XANAX) 0.5 MG tablet Take 1 tablet (0.5 mg total) by mouth 3 (three) times daily as needed for anxiety. 07/24/17  Yes Lazaro Arms, MD  aspirin EC 81 MG tablet Take 81 mg by mouth daily.    Yes [provider]  FLUoxetine (PROZAC) 20 MG capsule Take 1 capsule (20 mg total) by mouth daily. 07/24/17  Yes Lazaro Arms, MD  furosemide (LASIX) 20 MG tablet Take 20 mg by mouth daily.  03/31/17 08/28/18 Yes [provider]  labetalol (NORMODYNE) 300 MG tablet Take 300 mg by mouth 3 (three) times daily.  07/10/17  Yes  [provider]  triamcinolone cream (KENALOG) 0.1 % Apply 1 application topically 2 (two) times daily. Patient not taking: Reported on 08/28/2018 05/14/18   Henderly, Britni A, PA-C  zolpidem (AMBIEN) 10 MG tablet Take 1 tablet (10 mg total) by mouth at bedtime as needed for sleep. 07/24/17 08/23/17  Lazaro Arms, MD    Family History Family History  Problem Relation Age of Onset  . Diabetes Paternal Grandfather   . Other Paternal Grandmother        low blood sugar  . Other Maternal Grandmother        gallstones-septic  . Other Maternal Grandfather        heart issues  . Hypertension Father   . Heart disease Father   . Diabetes Father   . Hypertension Mother   . Cancer Mother        ovarian  . Diabetes Brother        borderline    Social History Social History   Tobacco Use  . Smoking status: Current Every Day Smoker    Packs/day: 0.50    Years: 15.00  Pack years: 7.50    Types: Cigarettes  . Smokeless tobacco: Never Used  Substance Use Topics  . Alcohol use: No  . Drug use: No     Allergies   Codeine and Hydrocodone   Review of Systems Review of Systems  Constitutional: Positive for chills and fever. Negative for activity change.       All ROS Neg except as noted in HPI  HENT: Positive for congestion. Negative for nosebleeds.   Eyes: Negative for photophobia and discharge.  Respiratory: Positive for cough. Negative for shortness of breath and wheezing.   Cardiovascular: Negative for chest pain and palpitations.  Gastrointestinal: Negative for abdominal pain and blood in stool.  Genitourinary: Negative for dysuria, frequency and hematuria.  Musculoskeletal: Positive for myalgias. Negative for arthralgias, back pain and neck pain.  Skin: Negative.   Neurological: Positive for headaches. Negative for dizziness, seizures and speech difficulty.  Psychiatric/Behavioral: Negative for confusion and hallucinations.     Physical Exam Updated Vital  Signs BP (!) 203/148 Comment: Hx of High Blood Pressure. Has NOT taken her BP meds this morning and took Decongestant last night  Pulse 62   Temp 99.2 F (37.3 C) (Oral)   Resp 18   Ht 5\' 5"  (1.651 m)   Wt 130.2 kg   LMP 07/28/2018   SpO2 97%   BMI 47.76 kg/m   Physical Exam Vitals signs and nursing note reviewed.  Constitutional:      Appearance: She is well-developed. She is not toxic-appearing.  HENT:     Head: Normocephalic.     Right Ear: Tympanic membrane and external ear normal.     Left Ear: Tympanic membrane and external ear normal.     Nose: Congestion present.  Eyes:     General: Lids are normal.     Pupils: Pupils are equal, round, and reactive to light.  Neck:     Musculoskeletal: Normal range of motion and neck supple.     Vascular: No carotid bruit.  Cardiovascular:     Rate and Rhythm: Regular rhythm. Tachycardia present.     Pulses: Normal pulses.     Heart sounds: Normal heart sounds.  Pulmonary:     Effort: No respiratory distress.     Breath sounds: Normal breath sounds.  Abdominal:     General: Bowel sounds are normal.     Palpations: Abdomen is soft.     Tenderness: There is no abdominal tenderness. There is no guarding.  Musculoskeletal: Normal range of motion.  Lymphadenopathy:     Head:     Right side of head: No submandibular adenopathy.     Left side of head: No submandibular adenopathy.     Cervical: No cervical adenopathy.  Skin:    General: Skin is warm and dry.     Findings: No rash.  Neurological:     Mental Status: She is alert and oriented to person, place, and time.     Cranial Nerves: No cranial nerve deficit.     Sensory: No sensory deficit.  Psychiatric:        Speech: Speech normal.      ED Treatments / Results  Labs (all labs ordered are listed, but only abnormal results are displayed) Labs Reviewed - No data to display  EKG None  Radiology No results found.  Procedures Procedures (including critical care  time)  Medications Ordered in ED Medications - No data to display   Initial Impression / Assessment and Plan / ED Course  I have reviewed the triage vital signs and the nursing notes.  Pertinent labs & imaging results that were available during my care of the patient were reviewed by me and considered in my medical decision making (see chart for details).       Final Clinical Impressions(s) / ED Diagnoses MDM Blood pressure is elevated.  Heart rate is elevated at 115.  Pulse oximetry is 96% on room air.  Within normal limits by my interpretation. Pt reports she did not take b/p medications.  Encouraged patient to stop smoking.  Patient to return in 3 days if fever is not resolving.  We discussed the importance of good handwashing, and good hydration.  We also discussed the contagious nature of this issue.  Patient will use a mask until symptoms have resolved.  She will use Tylenol every 4 hours or ibuprofen every 6 hours for fever or aching.  Prescription for Tamiflu has been provided.  I have strongly encouraged patient to get plenty of rest.  Patient acknowledges these instructions.   Final diagnoses:  Influenza    ED Discharge Orders         Ordered    oseltamivir (TAMIFLU) 75 MG capsule  Every 12 hours     08/28/18 1254    loratadine-pseudoephedrine (CLARITIN-D 12 HOUR) 5-120 MG tablet  2 times daily     08/28/18 1259           Ivery QualeBryant, Trigo Winterbottom, PA-C 08/28/18 1305    Doug SouJacubowitz, Sam, MD 08/28/18 1520

## 2019-07-29 ENCOUNTER — Other Ambulatory Visit: Payer: Self-pay

## 2019-07-29 ENCOUNTER — Emergency Department (HOSPITAL_COMMUNITY)
Admission: EM | Admit: 2019-07-29 | Discharge: 2019-07-29 | Disposition: A | Discharge status: Home / Self Care | Payer: BC Managed Care – PPO | Attending: Emergency Medicine | Admitting: Emergency Medicine

## 2019-07-29 ENCOUNTER — Encounter (HOSPITAL_COMMUNITY): Payer: Self-pay

## 2019-07-29 DIAGNOSIS — U071 COVID-19: Secondary | ICD-10-CM | POA: Insufficient documentation

## 2019-07-29 DIAGNOSIS — R05 Cough: Secondary | ICD-10-CM | POA: Diagnosis not present

## 2019-07-29 DIAGNOSIS — Z5321 Procedure and treatment not carried out due to patient leaving prior to being seen by health care provider: Secondary | ICD-10-CM | POA: Insufficient documentation

## 2019-07-29 NOTE — ED Triage Notes (Signed)
Pt presents to ED with complaints of cough. Pt tested positive for Covid on Tuesday and was given inhaler and prednisone. Pt states her cough has gotten worse since Tuesday.

## 2020-02-11 ENCOUNTER — Encounter (HOSPITAL_BASED_OUTPATIENT_CLINIC_OR_DEPARTMENT_OTHER): Payer: Self-pay

## 2020-02-11 ENCOUNTER — Other Ambulatory Visit: Payer: Self-pay

## 2020-02-11 ENCOUNTER — Emergency Department (HOSPITAL_BASED_OUTPATIENT_CLINIC_OR_DEPARTMENT_OTHER)
Admission: EM | Admit: 2020-02-11 | Discharge: 2020-02-11 | Disposition: A | Discharge status: Home / Self Care | Payer: BC Managed Care – PPO | Attending: Emergency Medicine | Admitting: Emergency Medicine

## 2020-02-11 ENCOUNTER — Emergency Department (HOSPITAL_BASED_OUTPATIENT_CLINIC_OR_DEPARTMENT_OTHER): Payer: BC Managed Care – PPO

## 2020-02-11 DIAGNOSIS — S0990XA Unspecified injury of head, initial encounter: Secondary | ICD-10-CM | POA: Diagnosis present

## 2020-02-11 DIAGNOSIS — S060X0A Concussion without loss of consciousness, initial encounter: Secondary | ICD-10-CM | POA: Diagnosis not present

## 2020-02-11 DIAGNOSIS — Y990 Civilian activity done for income or pay: Secondary | ICD-10-CM | POA: Insufficient documentation

## 2020-02-11 DIAGNOSIS — Z7982 Long term (current) use of aspirin: Secondary | ICD-10-CM | POA: Insufficient documentation

## 2020-02-11 DIAGNOSIS — Z8679 Personal history of other diseases of the circulatory system: Secondary | ICD-10-CM | POA: Insufficient documentation

## 2020-02-11 DIAGNOSIS — I10 Essential (primary) hypertension: Secondary | ICD-10-CM | POA: Insufficient documentation

## 2020-02-11 DIAGNOSIS — Y9389 Activity, other specified: Secondary | ICD-10-CM | POA: Insufficient documentation

## 2020-02-11 DIAGNOSIS — Y92410 Unspecified street and highway as the place of occurrence of the external cause: Secondary | ICD-10-CM | POA: Diagnosis not present

## 2020-02-11 DIAGNOSIS — F17210 Nicotine dependence, cigarettes, uncomplicated: Secondary | ICD-10-CM | POA: Diagnosis not present

## 2020-02-11 DIAGNOSIS — Z955 Presence of coronary angioplasty implant and graft: Secondary | ICD-10-CM | POA: Diagnosis not present

## 2020-02-11 MED ORDER — KETOROLAC TROMETHAMINE 60 MG/2ML IM SOLN
60.0000 mg | Freq: Once | INTRAMUSCULAR | Status: AC
  Administered 2020-02-11: 60 mg via INTRAMUSCULAR
  Filled 2020-02-11: qty 2

## 2020-02-11 MED ORDER — LABETALOL HCL 300 MG PO TABS
300.0000 mg | ORAL_TABLET | Freq: Three times a day (TID) | ORAL | Status: DC
  Filled 2020-02-11: qty 1
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
## 2020-02-11 NOTE — ED Notes (Signed)
ED Provider at bedside. 
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
## 2020-02-11 NOTE — Discharge Instructions (Addendum)
To follow-up with your primary care doctor about the CT scan findings.  There are possible chronic vascular changes due to plaques in your vessels which may require further follow-up MRI imaging.  Your symptoms are likely due to a concussion, please make sure to rest, take ibuprofen Tylenol for pain.  Please follow-up with the [facility] sports medicine if your concussion symptoms not improved.  Please make sure to take your blood pressure medication consistently, and resume taking aspirin given your history of

## 2020-02-12 ENCOUNTER — Telehealth: Payer: Self-pay | Admitting: Family Medicine
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
## 2020-02-12 NOTE — Telephone Encounter (Signed)
Called pt and scheduled her for tomorrow at 2 pm for new pt concussion visit.

## 2020-02-13 ENCOUNTER — Encounter: Payer: Self-pay | Admitting: Family Medicine

## 2020-02-13 ENCOUNTER — Other Ambulatory Visit: Payer: Self-pay

## 2020-02-13 ENCOUNTER — Ambulatory Visit: Payer: BC Managed Care – PPO | Admitting: Family Medicine

## 2020-02-13 VITALS — BP 190/110 | HR 91 | Ht 64.0 in | Wt 269.6 lb

## 2020-02-13 DIAGNOSIS — I10 Essential (primary) hypertension: Secondary | ICD-10-CM | POA: Diagnosis not present

## 2020-02-13 DIAGNOSIS — S060X0A Concussion without loss of consciousness, initial encounter: Secondary | ICD-10-CM | POA: Diagnosis not present

## 2020-02-13 LAB — CBC
HCT: 44 % (ref 36.0–46.0)
Hemoglobin: 14.1 g/dL (ref 12.0–15.0)
MCHC: 32.1 g/dL (ref 30.0–36.0)
MCV: 82 fl (ref 78.0–100.0)
Platelets: 234 10*3/uL (ref 150.0–400.0)
RBC: 5.36 Mil/uL — ABNORMAL HIGH (ref 3.87–5.11)
RDW: 16.9 % — ABNORMAL HIGH (ref 11.5–15.5)
WBC: 12.4 10*3/uL — ABNORMAL HIGH (ref 4.0–10.5)

## 2020-02-13 LAB — TSH: TSH: 2.16 u[IU]/mL (ref 0.35–4.50)

## 2020-02-13 MED ORDER — FLUOXETINE HCL 20 MG PO CAPS: 20.0000 mg | ORAL_CAPSULE | Freq: Every day | ORAL | 3 refills | Status: AC

## 2020-02-13 MED ORDER — LABETALOL HCL 300 MG PO TABS: 300.0000 mg | ORAL_TABLET | Freq: Three times a day (TID) | ORAL | 3 refills | Status: DC

## 2020-02-13 NOTE — Patient Instructions (Addendum)
Thank you for coming in today.  I will send notes to Dr Margo Aye in [place].   Restart labetalol and Prozac.  Get labs today.   Recheck with me in 2 weeks (about)  Advance activity as tolerated.

## 2020-02-13 NOTE — Progress Notes (Signed)
Note duplication error 
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
## 2020-02-14 LAB — COMPREHENSIVE METABOLIC PANEL
ALT: 11 U/L (ref 0–35)
AST: 14 U/L (ref 0–37)
Albumin: 3.9 g/dL (ref 3.5–5.2)
Alkaline Phosphatase: 93 U/L (ref 39–117)
BUN: 11 mg/dL (ref 6–23)
CO2: 24 mEq/L (ref 19–32)
Calcium: 9 mg/dL (ref 8.4–10.5)
Chloride: 103 mEq/L (ref 96–112)
Creatinine, Ser: 0.77 mg/dL (ref 0.40–1.20)
GFR: 84.91 mL/min (ref >60.00)
Glucose, Bld: 138 mg/dL — ABNORMAL HIGH (ref 70–99)
Potassium: 4.3 mEq/L (ref 3.5–5.1)
Sodium: 137 mEq/L (ref 135–145)
Total Bilirubin: 0.4 mg/dL (ref 0.2–1.2)
Total Protein: 7.3 g/dL (ref 6.0–8.3)

## 2020-02-14 LAB — LDL CHOLESTEROL, DIRECT: Direct LDL: 156 mg/dL

## 2020-02-14 NOTE — Progress Notes (Signed)
Kidney function looks okay.  Blood sugar is mildly elevated.Cholesterol is elevated.  PCP should probably start cholesterol-lowering medication. Thyroid test look normal.No anemia.

## 2020-02-24 ENCOUNTER — Other Ambulatory Visit: Payer: Self-pay

## 2020-02-24 ENCOUNTER — Inpatient Hospital Stay (HOSPITAL_COMMUNITY)
Admission: EM | Admit: 2020-02-24 | Discharge: 2020-02-28 | DRG: 304 | Disposition: A | Discharge status: Home / Self Care | Payer: BC Managed Care – PPO | Attending: Internal Medicine | Admitting: Internal Medicine

## 2020-02-24 ENCOUNTER — Emergency Department (HOSPITAL_COMMUNITY): Payer: BC Managed Care – PPO

## 2020-02-24 ENCOUNTER — Encounter (HOSPITAL_COMMUNITY): Payer: Self-pay

## 2020-02-24 DIAGNOSIS — Z8249 Family history of ischemic heart disease and other diseases of the circulatory system: Secondary | ICD-10-CM

## 2020-02-24 DIAGNOSIS — I10 Essential (primary) hypertension: Secondary | ICD-10-CM

## 2020-02-24 DIAGNOSIS — Z885 Allergy status to narcotic agent status: Secondary | ICD-10-CM

## 2020-02-24 DIAGNOSIS — I340 Nonrheumatic mitral (valve) insufficiency: Secondary | ICD-10-CM | POA: Diagnosis present

## 2020-02-24 DIAGNOSIS — I161 Hypertensive emergency: Secondary | ICD-10-CM | POA: Diagnosis not present

## 2020-02-24 DIAGNOSIS — Z9114 Patient's other noncompliance with medication regimen: Secondary | ICD-10-CM

## 2020-02-24 DIAGNOSIS — E876 Hypokalemia: Secondary | ICD-10-CM | POA: Diagnosis present

## 2020-02-24 DIAGNOSIS — R519 Headache, unspecified: Secondary | ICD-10-CM | POA: Diagnosis not present

## 2020-02-24 DIAGNOSIS — Z833 Family history of diabetes mellitus: Secondary | ICD-10-CM

## 2020-02-24 DIAGNOSIS — F17210 Nicotine dependence, cigarettes, uncomplicated: Secondary | ICD-10-CM | POA: Diagnosis present

## 2020-02-24 DIAGNOSIS — E782 Mixed hyperlipidemia: Secondary | ICD-10-CM | POA: Diagnosis present

## 2020-02-24 DIAGNOSIS — I248 Other forms of acute ischemic heart disease: Secondary | ICD-10-CM | POA: Diagnosis present

## 2020-02-24 DIAGNOSIS — R778 Other specified abnormalities of plasma proteins: Secondary | ICD-10-CM

## 2020-02-24 DIAGNOSIS — Z955 Presence of coronary angioplasty implant and graft: Secondary | ICD-10-CM

## 2020-02-24 DIAGNOSIS — I252 Old myocardial infarction: Secondary | ICD-10-CM

## 2020-02-24 DIAGNOSIS — I110 Hypertensive heart disease with heart failure: Secondary | ICD-10-CM | POA: Diagnosis present

## 2020-02-24 DIAGNOSIS — I5033 Acute on chronic diastolic (congestive) heart failure: Secondary | ICD-10-CM | POA: Diagnosis present

## 2020-02-24 DIAGNOSIS — J810 Acute pulmonary edema: Secondary | ICD-10-CM

## 2020-02-24 DIAGNOSIS — R072 Precordial pain: Secondary | ICD-10-CM | POA: Diagnosis not present

## 2020-02-24 DIAGNOSIS — E669 Obesity, unspecified: Secondary | ICD-10-CM | POA: Diagnosis present

## 2020-02-24 DIAGNOSIS — I160 Hypertensive urgency: Secondary | ICD-10-CM

## 2020-02-24 DIAGNOSIS — Z87820 Personal history of traumatic brain injury: Secondary | ICD-10-CM

## 2020-02-24 DIAGNOSIS — Z20822 Contact with and (suspected) exposure to covid-19: Secondary | ICD-10-CM | POA: Diagnosis present

## 2020-02-24 DIAGNOSIS — R079 Chest pain, unspecified: Secondary | ICD-10-CM | POA: Diagnosis present

## 2020-02-24 DIAGNOSIS — Z720 Tobacco use: Secondary | ICD-10-CM | POA: Diagnosis present

## 2020-02-24 DIAGNOSIS — I2510 Atherosclerotic heart disease of native coronary artery without angina pectoris: Secondary | ICD-10-CM | POA: Diagnosis present

## 2020-02-24 DIAGNOSIS — J811 Chronic pulmonary edema: Secondary | ICD-10-CM

## 2020-02-24 DIAGNOSIS — Z6841 Body Mass Index (BMI) 40.0 and over, adult: Secondary | ICD-10-CM

## 2020-02-24 DIAGNOSIS — Z79899 Other long term (current) drug therapy: Secondary | ICD-10-CM

## 2020-02-24 HISTORY — DX: Essential (primary) hypertension: I10

## 2020-02-24 HISTORY — DX: Atherosclerotic heart disease of native coronary artery without angina pectoris: I25.10

## 2020-02-24 HISTORY — DX: Hyperlipidemia, unspecified: E78.5

## 2020-02-24 LAB — BASIC METABOLIC PANEL
Anion gap: 9 (ref 5–15)
BUN: 15 mg/dL (ref 6–20)
CO2: 24 mmol/L (ref 22–32)
Calcium: 8.8 mg/dL — ABNORMAL LOW (ref 8.9–10.3)
Chloride: 106 mmol/L (ref 98–111)
Creatinine, Ser: 0.86 mg/dL (ref 0.44–1.00)
GFR calc Af Amer: >60 mL/min (ref >60)
GFR calc non Af Amer: >60 mL/min (ref >60)
Glucose, Bld: 103 mg/dL — ABNORMAL HIGH (ref 70–99)
Potassium: 3.7 mmol/L (ref 3.5–5.1)
Sodium: 139 mmol/L (ref 135–145)

## 2020-02-24 LAB — CBC
HCT: 43.2 % (ref 36.0–46.0)
Hemoglobin: 13.4 g/dL (ref 12.0–15.0)
MCH: 26.3 pg (ref 26.0–34.0)
MCHC: 31 g/dL (ref 30.0–36.0)
MCV: 84.7 fL (ref 80.0–100.0)
Platelets: 349 10*3/uL (ref 150–400)
RBC: 5.1 MIL/uL (ref 3.87–5.11)
RDW: 15.6 % — ABNORMAL HIGH (ref 11.5–15.5)
WBC: 13 10*3/uL — ABNORMAL HIGH (ref 4.0–10.5)
nRBC: 0 % (ref 0.0–0.2)

## 2020-02-24 LAB — POC URINE PREG, ED: Preg Test, Ur: NEGATIVE

## 2020-02-24 LAB — BRAIN NATRIURETIC PEPTIDE: B Natriuretic Peptide: 334 pg/mL — ABNORMAL HIGH (ref 0.0–100.0)

## 2020-02-24 LAB — SARS CORONAVIRUS 2 BY RT PCR (HOSPITAL ORDER, PERFORMED IN ~~LOC~~ HOSPITAL LAB): SARS Coronavirus 2: NEGATIVE

## 2020-02-24 LAB — TROPONIN I (HIGH SENSITIVITY)
Troponin I (High Sensitivity): 15 ng/L (ref <18)
Troponin I (High Sensitivity): 27 ng/L — ABNORMAL HIGH (ref <18)

## 2020-02-24 LAB — PROTIME-INR
INR: 1.1 (ref 0.8–1.2)
Prothrombin Time: 13.5 seconds (ref 11.4–15.2)

## 2020-02-24 MED ORDER — NICOTINE 21 MG/24HR TD PT24
21.0000 mg | MEDICATED_PATCH | Freq: Every day | TRANSDERMAL | Status: DC
  Administered 2020-02-24 – 2020-02-28 (×5): 21 mg via TRANSDERMAL
  Filled 2020-02-24 (×5): qty 1

## 2020-02-24 MED ORDER — ASPIRIN EC 81 MG PO TBEC
81.0000 mg | DELAYED_RELEASE_TABLET | Freq: Every day | ORAL | Status: AC
  Administered 2020-02-25 – 2020-02-27 (×3): 81 mg via ORAL
  Filled 2020-02-24 (×3): qty 1

## 2020-02-24 MED ORDER — FUROSEMIDE 10 MG/ML IJ SOLN
40.0000 mg | Freq: Two times a day (BID) | INTRAMUSCULAR | Status: DC
  Administered 2020-02-25 – 2020-02-26 (×4): 40 mg via INTRAVENOUS
  Filled 2020-02-24 (×5): qty 4

## 2020-02-24 MED ORDER — HEPARIN BOLUS VIA INFUSION
4000.0000 [IU] | Freq: Once | INTRAVENOUS | Status: AC
  Administered 2020-02-24: 4000 [IU] via INTRAVENOUS

## 2020-02-24 MED ORDER — FUROSEMIDE 40 MG PO TABS
40.0000 mg | ORAL_TABLET | Freq: Once | ORAL | Status: AC
  Administered 2020-02-24: 40 mg via ORAL
  Filled 2020-02-24: qty 1

## 2020-02-24 MED ORDER — NITROGLYCERIN IN D5W 200-5 MCG/ML-% IV SOLN
5.0000 ug/min | INTRAVENOUS | Status: DC
  Administered 2020-02-24: 5 ug/min via INTRAVENOUS
  Administered 2020-02-25 (×2): 75 ug/min via INTRAVENOUS
  Administered 2020-02-26: 5 ug/min via INTRAVENOUS
  Filled 2020-02-24 (×4): qty 250

## 2020-02-24 MED ORDER — ASPIRIN 81 MG PO CHEW
324.0000 mg | CHEWABLE_TABLET | Freq: Once | ORAL | Status: AC
  Administered 2020-02-24: 324 mg via ORAL
  Filled 2020-02-24: qty 4

## 2020-02-24 MED ORDER — LABETALOL HCL 200 MG PO TABS
300.0000 mg | ORAL_TABLET | Freq: Once | ORAL | Status: AC
  Administered 2020-02-24: 300 mg via ORAL
  Filled 2020-02-24: qty 2

## 2020-02-24 MED ORDER — ONDANSETRON HCL 4 MG/2ML IJ SOLN: 4.0000 mg | Freq: Four times a day (QID) | INTRAMUSCULAR | Status: DC | PRN

## 2020-02-24 MED ORDER — ACETAMINOPHEN 325 MG PO TABS
650.0000 mg | ORAL_TABLET | ORAL | Status: DC | PRN
  Administered 2020-02-24 – 2020-02-26 (×6): 650 mg via ORAL
  Filled 2020-02-24 (×6): qty 2

## 2020-02-24 MED ORDER — HEPARIN (PORCINE) 25000 UT/250ML-% IV SOLN
1450.0000 [IU]/h | INTRAVENOUS | Status: DC
  Administered 2020-02-24: 1100 [IU]/h via INTRAVENOUS
  Filled 2020-02-24: qty 250

## 2020-02-24 NOTE — Progress Notes (Signed)
ANTICOAGULATION CONSULT NOTE - Initial Consult  Pharmacy Consult for Heparin Indication: chest pain/ACS  Allergies  Allergen Reactions  . Codeine Nausea And Vomiting  . Hydrocodone Nausea Only    Patient Measurements: Height: 5\' 4"  (162.6 cm) Weight: 122 kg (269 lb) IBW/kg (Calculated) : 54.7 Heparin Dosing Weight: 84.5 kg   Vital Signs: Temp: 98.8 F (37.1 C) (07/05 1600) BP: 200/133 (07/05 1900) Pulse Rate: 75 (07/05 1900)  Labs: Recent Labs    02/24/20 1628 02/24/20 1812  HGB 13.4  --   HCT 43.2  --   PLT 349  --   CREATININE 0.86  --   TROPONINIHS 15 27*    Estimated Creatinine Clearance: 117.6 mL/min (by C-G formula based on SCr of 0.86 mg/dL).   Medical History: Past Medical History:  Diagnosis Date  . Coronary artery disease    MI  2014  2 stents  . Heart attack [facility])    has 2 stents  . Hyperlipidemia   . Hypertension     Assessment: 34 yo female presented on 02/24/2020 with chest pain that began at ~0300 and off and on until ~1300 when it became constant. PMH includes HTN, HLD, and previous cardiac cath with 2 stents in 2014. Patient is not on an anticoagulant prior to admission. Pharmacy consulted to dose heparin for ACS. Troponin was 15 on admission and rose to 27. Hgb 13.4 Plt wnl.   Goal of Therapy:  Heparin level 0.3-0.7 units/ml Monitor platelets by anticoagulation protocol: Yes   Plan:  Heparin 4000 unit bolus  Start heparin 1100 units/hr  Check heparin level at 0330 Monitor heparin level, CBC and S/S of bleeding daily  Follow up cardiology recommendations    2015, PharmD Clinical Pharmacist  02/24/2020,8:38 PM

## 2020-02-24 NOTE — ED Triage Notes (Signed)
Pt reports that her left shoulder and left shoulder have been hurting intermittently since 3 am, but since 1pm it has been a dull ache

## 2020-02-24 NOTE — ED Provider Notes (Addendum)
[facility] EMERGENCY DEPARTMENT Provider Note   CSN: [id number] Arrival date & time: 02/24/20  1550     History Chief Complaint  Patient presents with  . Chest Pain    Chelsea Petty is a 34 y.o. female.  Patient with known coronary disease.  Patient status post cardiac catheterization in 2014 with 2 stents placed.  This was done at [facility].  Patient has been away from cardiology but recently set up an appointment.  Patient is also being followed by Dr. Following her concussion.  And they have been working on her hypertension.  Patient currently just taking labetalol 300 mg 3 times a day.  Patient was on Lasix in the past.  But has not taken any recently.  Patient with onset 3 in the morning of kind of upper anterior chest pain and and left shoulder pain.  Not made worse by movement of the arm.  It was on and off until 1:00 this afternoon and then it became constant.  Is described as a dull ache.  No shortness of breath no nausea vomiting.  No headache.  Patient did have both Covid vaccines.     HPI: A 34 year old patient with a history of hypertension and hypercholesterolemia presents for evaluation of chest pain. Initial onset of pain was less than one hour ago. The patient's chest pain is described as heaviness/pressure/tightness and is not worse with exertion. The patient's chest pain is middle- or left-sided, is not well-localized, is not sharp and does not radiate to the arms/jaw/neck. The patient does not complain of nausea and denies diaphoresis. The patient has smoked in the past 90 days. The patient has no history of stroke, has no history of peripheral artery disease, denies any history of treated diabetes, has no relevant family history of coronary artery disease (first degree relative at less than age 14) and does not have an elevated BMI (>=30).   Past Medical History:  Diagnosis Date  . Coronary artery disease    MI  2014  2 stents  . Heart attack [facility])    has 2 stents   . Hyperlipidemia   . Hypertension     Patient Active Problem List   Diagnosis Date Noted  . Essential hypertension 07/28/2015  . Amenorrhea 03/10/2015  . Tobacco abuse 09/30/2013  . CAD (coronary artery disease) 12/05/2012  . Palpitations 12/05/2012  . 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 Obesity 11/12/2012    Past Surgical History:  Procedure Laterality Date  . CORONARY STENT PLACEMENT       OB History    Gravida  0   Para  0   Term  0   Preterm  0   AB  0   Living  0     SAB  0   TAB  0   Ectopic  0   Multiple  0   Live Births  0           Family History  Problem Relation Age of Onset  . Diabetes Paternal Grandfather   . Other Paternal Grandmother        low blood sugar  . Other Maternal Grandmother        gallstones-septic  . Other Maternal Grandfather        heart issues  . Hypertension Father   . Heart disease Father   . Diabetes Father   . Hypertension Mother   . Cancer Mother        ovarian  . Diabetes Brother  borderline    Social History   Tobacco Use  . Smoking status: Current Every Day Smoker    Packs/day: 0.50    Years: 15.00    Pack years: 7.50    Types: Cigarettes  . Smokeless tobacco: Never Used  Vaping Use  . Vaping Use: Never used  Substance Use Topics  . Alcohol use: No  . Drug use: No    Home Medications Prior to Admission medications   Medication Sig Start Date End Date Taking? Authorizing Provider  dextromethorphan-guaiFENesin (MUCINEX DM) 30-600 MG 12hr tablet Take 1 tablet by mouth 2 (two) times daily as needed for cough.   Yes [provider]  FLUoxetine (PROZAC) 20 MG capsule Take 1 capsule (20 mg total) by mouth daily. 02/13/20  Yes Rodolph Bong, MD  labetalol (NORMODYNE) 300 MG tablet Take 1 tablet (300 mg total) by mouth 3 (three) times daily. 02/13/20  Yes Rodolph Bong, MD  furosemide (LASIX) 20 MG tablet Take 20 mg by mouth daily.  03/31/17 08/28/18  [provider]     Allergies    Codeine and Hydrocodone  Review of Systems   Review of Systems  Constitutional: Negative for chills and fever.  HENT: Negative for congestion, rhinorrhea and sore throat.   Eyes: Negative for visual disturbance.  Respiratory: Negative for cough and shortness of breath.   Cardiovascular: Positive for chest pain. Negative for leg swelling.  Gastrointestinal: Negative for abdominal pain, diarrhea, nausea and vomiting.  Genitourinary: Negative for dysuria.  Musculoskeletal: Negative for back pain and neck pain.  Skin: Negative for rash.  Neurological: Negative for dizziness, light-headedness and headaches.  Hematological: Does not bruise/bleed easily.  Psychiatric/Behavioral: Negative for confusion.    Physical Exam Updated Vital Signs BP (!) 200/133   Pulse 75   Temp 98.8 F (37.1 C)   Resp (!) 23   Ht 1.626 m (5\' 4" )   Wt 122 kg   LMP 02/20/2020   SpO2 97%   BMI 46.17 kg/m   Physical Exam Vitals and nursing note reviewed.  Constitutional:      General: She is not in acute distress.    Appearance: Normal appearance. She is well-developed.  HENT:     Head: Normocephalic and atraumatic.  Eyes:     Extraocular Movements: Extraocular movements intact.     Conjunctiva/sclera: Conjunctivae normal.     Pupils: Pupils are equal, round, and reactive to light.  Cardiovascular:     Rate and Rhythm: Normal rate and regular rhythm.     Heart sounds: No murmur heard.   Pulmonary:     Effort: Pulmonary effort is normal. No respiratory distress.     Breath sounds: Normal breath sounds.  Chest:     Chest wall: No tenderness.  Abdominal:     Palpations: Abdomen is soft.     Tenderness: There is no abdominal tenderness.  Musculoskeletal:        General: No swelling. Normal range of motion.     Cervical back: Normal range of motion and neck supple.  Skin:    General: Skin is warm and dry.  Neurological:     General: No focal deficit present.     Mental  Status: She is alert and oriented to person, place, and time.     Cranial Nerves: No cranial nerve deficit.     Sensory: No sensory deficit.     Motor: No weakness.     ED Results / Procedures / Treatments   Labs (  all labs ordered are listed, but only abnormal results are displayed) Labs Reviewed  BASIC METABOLIC PANEL - Abnormal; Notable for the following components:      Result Value   Glucose, Bld 103 (*)    Calcium 8.8 (*)    All other components within normal limits  CBC - Abnormal; Notable for the following components:   WBC 13.0 (*)    RDW 15.6 (*)    All other components within normal limits  TROPONIN I (HIGH SENSITIVITY) - Abnormal; Notable for the following components:   Troponin I (High Sensitivity) 27 (*)    All other components within normal limits  SARS CORONAVIRUS 2 BY RT PCR (HOSPITAL ORDER, PERFORMED IN [facility])  POC URINE PREG, ED  TROPONIN I (HIGH SENSITIVITY)    EKG EKG Interpretation  Date/Time:  Monday February 24 2020 16:01:46 EDT Ventricular Rate:  82 PR Interval:    QRS Duration: 97 QT Interval:  387 QTC Calculation: 452 R Axis:   77 Text Interpretation: Sinus rhythm Borderline repolarization abnormality Baseline wander in lead(s) III aVL Confirmed by Vanetta Mulders [phone]) on 02/24/2020 4:05:13 PM   Radiology DG Chest 2 View  Result Date: 02/24/2020 CLINICAL DATA:  Chest pain and shortness of breath EXAM: CHEST - 2 VIEW COMPARISON:  Chest radiograph dated 02/03/2008 FINDINGS: The heart is enlarged. Mild bilateral lower lung predominant interstitial opacities are noted. There is no pleural effusion or pneumothorax. The osseous structures are intact. IMPRESSION: 1. Mild bilateral lower lung predominant interstitial opacities likely represent pulmonary edema. 2. Cardiomegaly. Electronically Signed   By: Romona Curls M.D.   On: 02/24/2020 16:51    Procedures Procedures (including critical care time)  CRITICAL CARE Performed by:  Vanetta Mulders Total critical care time: 35 minutes Critical care time was exclusive of separately billable procedures and treating other patients. Critical care was necessary to treat or prevent imminent or life-threatening deterioration. Critical care was time spent personally by me on the following activities: development of treatment plan with patient and/or surrogate as well as nursing, discussions with consultants, evaluation of patient's response to treatment, examination of patient, obtaining history from patient or surrogate, ordering and performing treatments and interventions, ordering and review of laboratory studies, ordering and review of radiographic studies, pulse oximetry and re-evaluation of patient's condition.   Medications Ordered in ED Medications  labetalol (NORMODYNE) tablet 300 mg (300 mg Oral Given 02/24/20 1734)  furosemide (LASIX) tablet 40 mg (40 mg Oral Given 02/24/20 1834)    ED Course  I have reviewed the triage vital signs and the nursing notes.  Pertinent labs & imaging results that were available during my care of the patient were reviewed by me and considered in my medical decision making (see chart for details).    MDM Rules/Calculators/A&P HEAR Score: 4                        Patient's heart score was a 4.  Patient with greater than 10 point change in her troponins.  The first 1 was 15 the second 1 was 27.  Patient was given her labetalol for her blood pressure.  Her 300 mg dose.  No real significant change in her pressure.  Patient also given 40 of Lasix based on the chest x-ray showed some lower lung fields changes consistent with pulmonary edema.  Patient without any hypoxia on room air.  Oxygen sats are in the upper 90s.  Patient states that her  pain went away approximately 2 hours ago.  And she has been pain-free.  Based on the elevated troponins in the greater than 10 point change hypertension and known coronary artery disease last evaluated in  2014 feel that patient needs admission.  Will originally discussed with hospitalist.  To see whether they want to admit here or [facility] or [facility] is available tonight.  Cardiology will be available here tomorrow.  Patient currently chest pain-free and in no acute distress.  Will discuss with the hospitalist what they would like to do about the elevated blood pressure.  In the face that she is having no pain currently.    Final Clinical Impression(s) / ED Diagnoses Final diagnoses:  Precordial pain  Elevated troponin  Hypertensive urgency    Rx / DC Orders ED Discharge Orders    None       Vanetta MuldersZackowski, Daton Szilagyi, MD 02/24/20 2000  Discussed with hospitalist.  We will start nitroglycerin drip due to the pulmonary edema to help control the blood pressure.  That order has been placed.  Patient be started on nitroglycerin drip.  Will be admitted here.    Vanetta MuldersZackowski, Naythen Heikkila, MD 02/24/20 2009

## 2020-02-24 NOTE — H&P (Addendum)
History and Physical  RUTHENE METHVIN IHK:[id number] DOB: [birth year] DOA: 02/24/2020  Referring physician: Vanetta Mulders, MD PCP: Patient, No Pcp Per  Patient coming from: Home  Chief Complaint: Chest Pain  HPI: Chelsea Petty is a 34 y.o. female with medical history significant for hypertension, hyperlipidemia, CAD s/p 2 stent placement, obesity and tobacco abuse who presents to the emergency department due to chest pain which started around 3 AM.  Patient states that chest pain started around left shoulder and radiates to left upper chest area, chest pain was described as dull in nature and was non-reproducible. It was initially intermittent till 1 PM, however, the chest pain became more constant after 1pm and she presents to the emergency department for further evaluation.  There was no known alleviating/aggravating factor.  Patient has had elevated blood pressure which has been difficult to control she was currently taking labetalol 300 mg 3 times daily, she was on Lasix in the past, but has not been taking this for some time.  Patient states that she is following up with her PCP regarding better control of her elevated blood pressure.  ED Course:  In the emergency department, BP was elevated at 215/133, she was tachypneic, but all other vital signs were within normal range.  Work-up in the ED showed normal CBC and BMP except for leukocytosis.  Troponin I 15>27.  Chest x-ray showed cardiomegaly and mild bilateral lower lung predominant interstitial opacities suspected to be pulmonary edema.  She was treated with labetalol 300 mg p.o. x1 and Lasix 40 mg p.o. x1 without much improvement in the blood pressure, patient was then started on IV nitroglycerin drip.  IV heparin drip was started day elevated troponin.  Hospitalist was asked to admit patient for further evaluation management.  Review of Systems: Constitutional: Negative for chills and fever.  HENT: Negative for ear pain and sore  throat.   Eyes: Negative for pain and visual disturbance.  Respiratory: Negative for cough, chest tightness and shortness of breath.   Cardiovascular: Positive for chest pain and negative for palpitations.  Gastrointestinal: Negative for abdominal pain and vomiting.  Endocrine: Negative for polyphagia and polyuria.  Genitourinary: Negative for decreased urine volume, dysuria, enuresis Musculoskeletal: Negative for arthralgias and back pain.  Skin: Negative for color change and rash.  Allergic/Immunologic: Negative for immunocompromised state.  Neurological: Negative for tremors, syncope, speech difficulty, weakness, light-headedness and headaches.  Hematological: Does not bruise/bleed easily.  All other systems reviewed and are negative   Past Medical History:  Diagnosis Date  . Coronary artery disease    MI  2014  2 stents  . Heart attack [facility])    has 2 stents  . Hyperlipidemia   . Hypertension    Past Surgical History:  Procedure Laterality Date  . CORONARY STENT PLACEMENT      Social History:  reports that she has been smoking cigarettes. She has a 7.50 pack-year smoking history. She has never used smokeless tobacco. She reports that she does not drink alcohol and does not use drugs.   Allergies  Allergen Reactions  . Codeine Nausea And Vomiting  . Hydrocodone Nausea Only    Family History  Problem Relation Age of Onset  . Diabetes Paternal Grandfather   . Other Paternal Grandmother        low blood sugar  . Other Maternal Grandmother        gallstones-septic  . Other Maternal Grandfather        heart issues  .  Hypertension Father   . Heart disease Father   . Diabetes Father   . Hypertension Mother   . Cancer Mother        ovarian  . Diabetes Brother        borderline    Prior to Admission medications   Medication Sig Start Date End Date Taking? Authorizing Provider  dextromethorphan-guaiFENesin (MUCINEX DM) 30-600 MG 12hr tablet Take 1 tablet by mouth 2  (two) times daily as needed for cough.   Yes [provider]  FLUoxetine (PROZAC) 20 MG capsule Take 1 capsule (20 mg total) by mouth daily. 02/13/20  Yes Rodolph Bong, MD  labetalol (NORMODYNE) 300 MG tablet Take 1 tablet (300 mg total) by mouth 3 (three) times daily. 02/13/20  Yes Rodolph Bong, MD  furosemide (LASIX) 20 MG tablet Take 20 mg by mouth daily.  03/31/17 08/28/18  [provider]    Physical Exam: BP (!) 200/133   Pulse 75   Temp 98.8 F (37.1 C)   Resp (!) 23   Ht 5\' 4"  (1.626 m)   Wt 122 kg   LMP 02/20/2020   SpO2 97%   BMI 46.17 kg/m   . General: 34 y.o. year-old female well developed well nourished in no acute distress.  Alert and oriented x3. 31 HEENT: NCAT, EOMI, PERRLA . Neck: Supple, trachea medial . Cardiovascular: Regular rate and rhythm with no rubs or gallops.  No thyromegaly or JVD noted.  No lower extremity edema. 2/4 pulses in all 4 extremities. Marland Kitchen Respiratory: Clear to auscultation with no wheezes or rales. Good inspiratory effort. . Abdomen: Soft nontender nondistended with normal bowel sounds x4 quadrants. . Muskuloskeletal: No cyanosis, clubbing or edema noted bilaterally . Neuro: CN II-XII intact, strength, sensation, reflexes . Skin: No ulcerative lesions noted or rashes . Psychiatry: Judgement and insight appear normal. Mood is appropriate for condition and setting          Labs on Admission:  Basic Metabolic Panel: Recent Labs  Lab 02/24/20 1628  NA 139  K 3.7  CL 106  CO2 24  GLUCOSE 103*  BUN 15  CREATININE 0.86  CALCIUM 8.8*   Liver Function Tests: No results for input(s): AST, ALT, ALKPHOS, BILITOT, PROT, ALBUMIN in the last 168 hours. No results for input(s): LIPASE, AMYLASE in the last 168 hours. No results for input(s): AMMONIA in the last 168 hours. CBC: Recent Labs  Lab 02/24/20 1628  WBC 13.0*  HGB 13.4  HCT 43.2  MCV 84.7  PLT 349   Cardiac Enzymes: No results for input(s): CKTOTAL, CKMB,  CKMBINDEX, TROPONINI in the last 168 hours.  BNP (last 3 results) Recent Labs    02/24/20 1628  BNP 334.0*    ProBNP (last 3 results) No results for input(s): PROBNP in the last 8760 hours.  CBG: No results for input(s): GLUCAP in the last 168 hours.  Radiological Exams on Admission: DG Chest 2 View  Result Date: 02/24/2020 CLINICAL DATA:  Chest pain and shortness of breath EXAM: CHEST - 2 VIEW COMPARISON:  Chest radiograph dated 02/03/2008 FINDINGS: The heart is enlarged. Mild bilateral lower lung predominant interstitial opacities are noted. There is no pleural effusion or pneumothorax. The osseous structures are intact. IMPRESSION: 1. Mild bilateral lower lung predominant interstitial opacities likely represent pulmonary edema. 2. Cardiomegaly. Electronically Signed   By: 02/05/2008 M.D.   On: 02/24/2020 16:51    EKG: I independently viewed the EKG done and my findings are as  followed: Sinus rhythm at a rate of 82 bpm  Assessment/Plan Present on Admission: . Chest pain . CAD (coronary artery disease) . Essential hypertension . Obesity . Tobacco abuse  Principal Problem:   Chest pain Active Problems:   CAD (coronary artery disease)   Essential hypertension   Obesity   Tobacco abuse   Hypertensive urgency   Pulmonary edema   Atypical chest pain rule out ACS Elevated troponin Heart score = 4 Patient will be placed in Observation status in progressive unit due to being on IV nitroglycerin drip Troponin 15>27, continue to trend troponin Patient is currently chest pain-free.  Chest pain may be due to hypertensive urgency, but considering patient's heart score and chronic elevated BP which was difficult to control as an outpatient, patient will need further cardiovascular risk stratification. She was started on IV heparin drip in the ED Cardiology will be consulted to help decide if Stress test is needed in am Versus other diagnostic modalities.    Continue aspirin  81mg , patient already on nitroglycerine drip Beta-blocker was already given in the ED  Patient will be placed n.p.o. after midnight in hospital for possible stress test in the morning  Hypertensive emergency Essential hypertension BP was 215/133 in the ED, labetalol and Lasix were given without much improvement, patient was then started on IV nitroglycerin, she will continue with same at this time with plan to transition her to oral meds once BP is better controlled. Continue telemetry  Pulmonary edema, Elevated BNP r/o CHF BNP 334, Chest x-ray showed cardiomegaly and mild bilateral lower lung predominant interstitial opacities suspected to be pulmonary edema.   These may be secondary to patient's elevated BP Lasix was given in the ED, continue with IV Lasix 40 mg  twice daily Continue total input/output, daily weights and fluid restriction Continue Cardiac diet  Echocardiogram in the morning   CAD s/p stent placement Continue aspirin, patient was not on any statin Lipid panel will be checked  Morbid obesity (BMI 46.17) Patient will follow with outpatient PCP for weight loss control   Tobacco abuse Patient was counseled on tobacco abuse cessation she verbalized understanding discussion. Continue nicotine patch  DVT prophylaxis: IV Heparin drip  Code Status: Full code  Family Communication: None at bedside   Disposition Plan:  Patient is from:                        home Anticipated DC to:                   home Anticipated DC date:               23 Hrs Anticipated DC barriers:         Pending cardiology evaluation recommendation   Consults called: Cardiology   Admission status: Observation    25 MD [facility]  If 7PM-7AM, please contact night-coverage [email or web address] 02/24/2020, 9:07 PM

## 2020-02-25 ENCOUNTER — Observation Stay (HOSPITAL_COMMUNITY): Payer: BC Managed Care – PPO

## 2020-02-25 ENCOUNTER — Encounter (HOSPITAL_COMMUNITY): Payer: Self-pay | Admitting: Internal Medicine

## 2020-02-25 DIAGNOSIS — F17210 Nicotine dependence, cigarettes, uncomplicated: Secondary | ICD-10-CM | POA: Diagnosis present

## 2020-02-25 DIAGNOSIS — R519 Headache, unspecified: Secondary | ICD-10-CM | POA: Diagnosis not present

## 2020-02-25 DIAGNOSIS — Z79899 Other long term (current) drug therapy: Secondary | ICD-10-CM | POA: Diagnosis not present

## 2020-02-25 DIAGNOSIS — I25119 Atherosclerotic heart disease of native coronary artery with unspecified angina pectoris: Secondary | ICD-10-CM | POA: Diagnosis not present

## 2020-02-25 DIAGNOSIS — E785 Hyperlipidemia, unspecified: Secondary | ICD-10-CM | POA: Diagnosis not present

## 2020-02-25 DIAGNOSIS — I160 Hypertensive urgency: Secondary | ICD-10-CM | POA: Diagnosis not present

## 2020-02-25 DIAGNOSIS — I2510 Atherosclerotic heart disease of native coronary artery without angina pectoris: Secondary | ICD-10-CM | POA: Diagnosis present

## 2020-02-25 DIAGNOSIS — R778 Other specified abnormalities of plasma proteins: Secondary | ICD-10-CM | POA: Diagnosis not present

## 2020-02-25 DIAGNOSIS — Z833 Family history of diabetes mellitus: Secondary | ICD-10-CM | POA: Diagnosis not present

## 2020-02-25 DIAGNOSIS — Z6841 Body Mass Index (BMI) 40.0 and over, adult: Secondary | ICD-10-CM | POA: Diagnosis not present

## 2020-02-25 DIAGNOSIS — Z955 Presence of coronary angioplasty implant and graft: Secondary | ICD-10-CM | POA: Diagnosis not present

## 2020-02-25 DIAGNOSIS — I5031 Acute diastolic (congestive) heart failure: Secondary | ICD-10-CM | POA: Diagnosis not present

## 2020-02-25 DIAGNOSIS — Z9114 Patient's other noncompliance with medication regimen: Secondary | ICD-10-CM | POA: Diagnosis not present

## 2020-02-25 DIAGNOSIS — I10 Essential (primary) hypertension: Secondary | ICD-10-CM | POA: Diagnosis not present

## 2020-02-25 DIAGNOSIS — Z8249 Family history of ischemic heart disease and other diseases of the circulatory system: Secondary | ICD-10-CM | POA: Diagnosis not present

## 2020-02-25 DIAGNOSIS — I340 Nonrheumatic mitral (valve) insufficiency: Secondary | ICD-10-CM | POA: Diagnosis present

## 2020-02-25 DIAGNOSIS — I161 Hypertensive emergency: Secondary | ICD-10-CM | POA: Diagnosis present

## 2020-02-25 DIAGNOSIS — I5033 Acute on chronic diastolic (congestive) heart failure: Secondary | ICD-10-CM | POA: Diagnosis present

## 2020-02-25 DIAGNOSIS — I110 Hypertensive heart disease with heart failure: Secondary | ICD-10-CM | POA: Diagnosis present

## 2020-02-25 DIAGNOSIS — I252 Old myocardial infarction: Secondary | ICD-10-CM | POA: Diagnosis not present

## 2020-02-25 DIAGNOSIS — Z20822 Contact with and (suspected) exposure to covid-19: Secondary | ICD-10-CM | POA: Diagnosis present

## 2020-02-25 DIAGNOSIS — E876 Hypokalemia: Secondary | ICD-10-CM | POA: Diagnosis present

## 2020-02-25 DIAGNOSIS — R072 Precordial pain: Secondary | ICD-10-CM | POA: Diagnosis present

## 2020-02-25 DIAGNOSIS — I248 Other forms of acute ischemic heart disease: Secondary | ICD-10-CM | POA: Diagnosis present

## 2020-02-25 DIAGNOSIS — R079 Chest pain, unspecified: Secondary | ICD-10-CM | POA: Diagnosis not present

## 2020-02-25 DIAGNOSIS — E782 Mixed hyperlipidemia: Secondary | ICD-10-CM | POA: Diagnosis present

## 2020-02-25 DIAGNOSIS — Z885 Allergy status to narcotic agent status: Secondary | ICD-10-CM | POA: Diagnosis not present

## 2020-02-25 DIAGNOSIS — Z87820 Personal history of traumatic brain injury: Secondary | ICD-10-CM | POA: Diagnosis not present

## 2020-02-25 LAB — CBC
HCT: 39.1 % (ref 36.0–46.0)
Hemoglobin: 12.3 g/dL (ref 12.0–15.0)
MCH: 26.6 pg (ref 26.0–34.0)
MCHC: 31.5 g/dL (ref 30.0–36.0)
MCV: 84.6 fL (ref 80.0–100.0)
Platelets: 334 10*3/uL (ref 150–400)
RBC: 4.62 MIL/uL (ref 3.87–5.11)
RDW: 15.8 % — ABNORMAL HIGH (ref 11.5–15.5)
WBC: 12.9 10*3/uL — ABNORMAL HIGH (ref 4.0–10.5)
nRBC: 0 % (ref 0.0–0.2)

## 2020-02-25 LAB — HEPARIN LEVEL (UNFRACTIONATED)
Heparin Unfractionated: <0.1 IU/mL — ABNORMAL LOW (ref 0.30–0.70)
Heparin Unfractionated: 0.18 IU/mL — ABNORMAL LOW (ref 0.30–0.70)

## 2020-02-25 LAB — MRSA PCR SCREENING: MRSA by PCR: NEGATIVE

## 2020-02-25 LAB — ECHOCARDIOGRAM COMPLETE
Height: 64 in
Weight: 4356.29 oz

## 2020-02-25 LAB — TROPONIN I (HIGH SENSITIVITY)
Troponin I (High Sensitivity): 49 ng/L — ABNORMAL HIGH (ref <18)
Troponin I (High Sensitivity): 45 ng/L — ABNORMAL HIGH (ref <18)

## 2020-02-25 LAB — HIV ANTIBODY (ROUTINE TESTING W REFLEX): HIV Screen 4th Generation wRfx: NONREACTIVE

## 2020-02-25 MED ORDER — ROSUVASTATIN CALCIUM 20 MG PO TABS: 20.0000 mg | ORAL_TABLET | Freq: Every day | ORAL | Status: DC

## 2020-02-25 MED ORDER — LOSARTAN POTASSIUM 50 MG PO TABS
50.0000 mg | ORAL_TABLET | Freq: Every day | ORAL | Status: DC
  Administered 2020-02-25 – 2020-02-26 (×2): 50 mg via ORAL
  Filled 2020-02-25 (×3): qty 1

## 2020-02-25 MED ORDER — ENOXAPARIN SODIUM 60 MG/0.6ML ~~LOC~~ SOLN
60.0000 mg | SUBCUTANEOUS | Status: DC
  Administered 2020-02-25 – 2020-02-27 (×3): 60 mg via SUBCUTANEOUS
  Filled 2020-02-25 (×3): qty 0.6

## 2020-02-25 MED ORDER — LABETALOL HCL 200 MG PO TABS
300.0000 mg | ORAL_TABLET | Freq: Two times a day (BID) | ORAL | Status: DC
  Administered 2020-02-25 – 2020-02-26 (×2): 300 mg via ORAL
  Filled 2020-02-25 (×2): qty 2

## 2020-02-25 MED ORDER — CHLORHEXIDINE GLUCONATE CLOTH 2 % EX PADS
6.0000 | MEDICATED_PAD | Freq: Every day | CUTANEOUS | Status: DC
  Administered 2020-02-25 – 2020-02-27 (×3): 6 via TOPICAL

## 2020-02-25 MED ORDER — LABETALOL HCL 200 MG PO TABS: 300.0000 mg | ORAL_TABLET | Freq: Three times a day (TID) | ORAL | Status: DC

## 2020-02-25 MED ORDER — IBUPROFEN 400 MG PO TABS
400.0000 mg | ORAL_TABLET | Freq: Once | ORAL | Status: AC
  Administered 2020-02-25: 400 mg via ORAL
  Filled 2020-02-25: qty 1

## 2020-02-25 MED ORDER — LABETALOL HCL 5 MG/ML IV SOLN
10.0000 mg | INTRAVENOUS | Status: DC | PRN
  Administered 2020-02-25 – 2020-02-26 (×4): 10 mg via INTRAVENOUS
  Filled 2020-02-25 (×4): qty 4

## 2020-02-25 MED ORDER — ROSUVASTATIN CALCIUM 20 MG PO TABS
20.0000 mg | ORAL_TABLET | Freq: Every day | ORAL | Status: DC
  Administered 2020-02-25 – 2020-02-27 (×3): 20 mg via ORAL
  Filled 2020-02-25 (×3): qty 1

## 2020-02-25 MED ORDER — ENOXAPARIN SODIUM 40 MG/0.4ML ~~LOC~~ SOLN: 40.0000 mg | SUBCUTANEOUS | Status: DC

## 2020-02-25 MED ORDER — FLUOXETINE HCL 20 MG PO CAPS
20.0000 mg | ORAL_CAPSULE | Freq: Every day | ORAL | Status: DC
  Administered 2020-02-25 – 2020-02-28 (×4): 20 mg via ORAL
  Filled 2020-02-25 (×4): qty 1

## 2020-02-25 MED ORDER — HEPARIN BOLUS VIA INFUSION
2000.0000 [IU] | Freq: Once | INTRAVENOUS | Status: AC
  Administered 2020-02-25: 2000 [IU] via INTRAVENOUS

## 2020-02-25 MED ORDER — LABETALOL HCL 200 MG PO TABS
300.0000 mg | ORAL_TABLET | Freq: Three times a day (TID) | ORAL | Status: DC
  Administered 2020-02-25: 300 mg via ORAL
  Filled 2020-02-25: qty 2

## 2020-02-25 NOTE — ED Notes (Signed)
Pt up to restroom.

## 2020-02-25 NOTE — Progress Notes (Signed)
   Progress Note  Patient Name: Chelsea Petty Date of Encounter: 02/25/2020  Brief follow-up note.  Echocardiogram reviewed with moderate LVH and LVEF 55 to 60% range.  We will plan to change labetalol to 300 mg twice daily, start Cozaar 50 mg daily, can continue IV diuresis for now.  Hopefully can wean off IV nitroglycerin as blood pressure trend improves.  Signed, Nona Dell, MD  02/25/2020, 1:45 PM

## 2020-02-25 NOTE — Progress Notes (Signed)
PROGRESS NOTE    Chelsea Petty  GYJ:[id number] DOB: Sep 16, 1985 DOA: 02/24/2020 PCP: Patient, No Pcp Per   Brief Narrative:  Per HPI: Chelsea Petty is a 34 y.o. female with medical history significant for hypertension, hyperlipidemia, CAD s/p 2 stent placement, obesity and tobacco abuse who presents to the emergency department due to chest pain which started around 3 AM.  Patient states that chest pain started around left shoulder and radiates to left upper chest area, chest pain was described as dull in nature and was non-reproducible. It was initially intermittent till 1 PM, however, the chest pain became more constant after 1pm and she presents to the emergency department for further evaluation.  There was no known alleviating/aggravating factor.  Patient has had elevated blood pressure which has been difficult to control she was currently taking labetalol 300 mg 3 times daily, she was on Lasix in the past, but has not been taking this for some time.  Patient states that she is following up with her PCP regarding better control of her elevated blood pressure.  ED Course:  In the emergency department, BP was elevated at 215/133, she was tachypneic, but all other vital signs were within normal range.  Work-up in the ED showed normal CBC and BMP except for leukocytosis.  Troponin I 15>27.  Chest x-ray showed cardiomegaly and mild bilateral lower lung predominant interstitial opacities suspected to be pulmonary edema.  She was treated with labetalol 300 mg p.o. x1 and Lasix 40 mg p.o. x1 without much improvement in the blood pressure, patient was then started on IV nitroglycerin drip.  IV heparin drip was started day elevated troponin.  Hospitalist was asked to admit patient for further evaluation management.  Assessment & Plan:   Principal Problem:   Chest pain Active Problems:   CAD (coronary artery disease)   Essential hypertension   Obesity   Tobacco abuse   Hypertensive urgency    Pulmonary edema   Atypical chest pain with elevated troponin likely secondary to demand ischemia -Appreciate cardiology evaluation -Troponins with flat trend and heparin drip has been discontinued -2D echocardiogram performed with results pending -BNP elevation with some pulmonary edema noted as well for which IV Lasix 40 mg twice daily has been started per cardiology.  Follow I's and O's and daily weights.  Hypertensive crisis -Poorly controlled hypertensive at home with medication noncompliance -She reportedly discontinued her medications 3 years ago and has been having trouble with control after reinitiation recently -Continue nitroglycerin drip and wean as tolerated -May need ACE inhibitor or ARB after 2D echocardiogram evaluation to further assist with blood pressure management  CAD with prior stents -Continue labetalol 3 times daily -Aspirin and Crestor resumed by cardiology  Dyslipidemia -LDL elevated to 156 last month -Crestor 20 mg daily started per cardiology  Morbid obesity -We will need lifestyle changes in outpatient setting  Tobacco abuse -Counseled on cessation -Nicotine patch   DVT prophylaxis: Heparin drip to be changed to Lovenox Code Status: Full code Family Communication: None at bedside Disposition Plan:   Status is: Inpatient  Remains inpatient appropriate because:IV treatments appropriate due to intensity of illness or inability to take PO and Inpatient level of care appropriate due to severity of illness   Dispo: The patient is from: Home              Anticipated d/c is to: Home              Anticipated d/c date is: 1 day  Patient currently is not medically stable to d/c.  Consultants:   Cardiology  Procedures:   2D echocardiogram 7/6 pending  Antimicrobials:   None   Subjective: Patient seen and evaluated today with no new acute complaints or concerns. No acute concerns or events noted overnight.  She denies any chest  pain or shortness of breath at this time.  Blood pressures are still quite elevated.  Objective: Vitals:   02/25/20 0900 02/25/20 1000 02/25/20 1100 02/25/20 1141  BP: (!) 199/116 (!) 150/80 (!) 162/105   Pulse: 80 71 69   Resp: 19 15 19    Temp:    98.9 F (37.2 C)  TempSrc:    Oral  SpO2: 98% 95% 95%   Weight:      Height:       No intake or output data in the 24 hours ending 02/25/20 1336 Filed Weights   02/24/20 1604 02/25/20 0757  Weight: 122 kg 123.5 kg    Examination:  General exam: Appears calm and comfortable, morbidly obese Respiratory system: Clear to auscultation. Respiratory effort normal. Cardiovascular system: S1 & S2 heard, RRR. No JVD, murmurs, rubs, gallops or clicks. No pedal edema. Gastrointestinal system: Abdomen is nondistended, soft and nontender. No organomegaly or masses felt. Normal bowel sounds heard. Central nervous system: Alert and oriented. No focal neurological deficits. Extremities: Symmetric 5 x 5 power. Skin: No rashes, lesions or ulcers Psychiatry: Judgement and insight appear normal. Mood & affect appropriate.     Data Reviewed: I have personally reviewed following labs and imaging studies  CBC: Recent Labs  Lab 02/24/20 1628 02/25/20 0336  WBC 13.0* 12.9*  HGB 13.4 12.3  HCT 43.2 39.1  MCV 84.7 84.6  PLT 349 334   Basic Metabolic Panel: Recent Labs  Lab 02/24/20 1628  NA 139  K 3.7  CL 106  CO2 24  GLUCOSE 103*  BUN 15  CREATININE 0.86  CALCIUM 8.8*   GFR: Estimated Creatinine Clearance: 118.5 mL/min (by C-G formula based on SCr of 0.86 mg/dL). Liver Function Tests: No results for input(s): AST, ALT, ALKPHOS, BILITOT, PROT, ALBUMIN in the last 168 hours. No results for input(s): LIPASE, AMYLASE in the last 168 hours. No results for input(s): AMMONIA in the last 168 hours. Coagulation Profile: Recent Labs  Lab 02/24/20 1628  INR 1.1   Cardiac Enzymes: No results for input(s): CKTOTAL, CKMB, CKMBINDEX,  TROPONINI in the last 168 hours. BNP (last 3 results) No results for input(s): PROBNP in the last 8760 hours. HbA1C: No results for input(s): HGBA1C in the last 72 hours. CBG: No results for input(s): GLUCAP in the last 168 hours. Lipid Profile: No results for input(s): CHOL, HDL, LDLCALC, TRIG, CHOLHDL, LDLDIRECT in the last 72 hours. Thyroid Function Tests: No results for input(s): TSH, T4TOTAL, FREET4, T3FREE, THYROIDAB in the last 72 hours. Anemia Panel: No results for input(s): VITAMINB12, FOLATE, FERRITIN, TIBC, IRON, RETICCTPCT in the last 72 hours. Sepsis Labs: No results for input(s): PROCALCITON, LATICACIDVEN in the last 168 hours.  Recent Results (from the past 240 hour(s))  SARS Coronavirus 2 by RT PCR (hospital order, performed in [facility] hospital lab) Nasopharyngeal Nasopharyngeal Swab     Status: None   Collection Time: 02/24/20  7:52 PM   Specimen: Nasopharyngeal Swab  Result Value Ref Range Status   SARS Coronavirus 2 NEGATIVE NEGATIVE Final    Comment: (NOTE) SARS-CoV-2 target nucleic acids are NOT DETECTED.  The SARS-CoV-2 RNA is generally detectable in upper and lower respiratory specimens  during the acute phase of infection. The lowest concentration of SARS-CoV-2 viral copies this assay can detect is 250 copies / mL. A negative result does not preclude SARS-CoV-2 infection and should not be used as the sole basis for treatment or other patient management decisions.  A negative result may occur with improper specimen collection / handling, submission of specimen other than nasopharyngeal swab, presence of viral mutation(s) within the areas targeted by this assay, and inadequate number of viral copies (<250 copies / mL). A negative result must be combined with clinical observations, patient history, and epidemiological information.  Fact Sheet for Patients:   BoilerBrush.com.cy  Fact Sheet for Healthcare  Providers: https://pope.com/  This test is not yet approved or  cleared by the [place] FDA and has been authorized for detection and/or diagnosis of SARS-CoV-2 by FDA under an Emergency Use Authorization (EUA).  This EUA will remain in effect (meaning this test can be used) for the duration of the COVID-19 declaration under Section 564(b)(1) of the Act, 21 U.S.C. section 360bbb-3(b)(1), unless the authorization is terminated or revoked sooner.  Performed at [facility], [address] [place], [place] [address]          Radiology Studies: DG Chest 2 View  Result Date: 02/24/2020 CLINICAL DATA:  Chest pain and shortness of breath EXAM: CHEST - 2 VIEW COMPARISON:  Chest radiograph dated 02/03/2008 FINDINGS: The heart is enlarged. Mild bilateral lower lung predominant interstitial opacities are noted. There is no pleural effusion or pneumothorax. The osseous structures are intact. IMPRESSION: 1. Mild bilateral lower lung predominant interstitial opacities likely represent pulmonary edema. 2. Cardiomegaly. Electronically Signed   By: Romona Curls M.D.   On: 02/24/2020 16:51        Scheduled Meds: . aspirin EC  81 mg Oral Daily  . Chlorhexidine Gluconate Cloth  6 each Topical Daily  . furosemide  40 mg Intravenous Q12H  . labetalol  300 mg Oral TID  . nicotine  21 mg Transdermal Daily  . rosuvastatin  20 mg Oral q1800   Continuous Infusions: . nitroGLYCERIN 50 mcg/min (02/25/20 1142)     LOS: 1 day    Time spent: 35 minutes    Lesle Faron D Sherryll Burger, DO [facility]  If 7PM-7AM, please contact night-coverage [email or web address] 02/25/2020, 1:36 PM

## 2020-02-25 NOTE — Progress Notes (Signed)
*  PRELIMINARY RESULTS* Echocardiogram 2D Echocardiogram has been performed.  Stacey Drain 02/25/2020, 10:43 AM
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
## 2020-02-25 NOTE — Progress Notes (Signed)
ANTICOAGULATION CONSULT NOTE  Pharmacy Consult for Heparin Indication: chest pain/ACS  Allergies  Allergen Reactions  . Codeine Nausea And Vomiting  . Hydrocodone Nausea Only    Patient Measurements: Height: 5\' 4"  (162.6 cm) Weight: 122 kg (269 lb) IBW/kg (Calculated) : 54.7 Heparin Dosing Weight: 84.5 kg   Vital Signs: BP: 145/89 (07/06 0400) Pulse Rate: 84 (07/05 2101)  Labs: Recent Labs    02/24/20 1628 02/24/20 1812 02/25/20 0336  HGB 13.4  --  12.3  HCT 43.2  --  39.1  PLT 349  --  334  LABPROT 13.5  --   --   INR 1.1  --   --   HEPARINUNFRC  --   --  <0.10*  CREATININE 0.86  --   --   TROPONINIHS 15 27*  --     Estimated Creatinine Clearance: 117.6 mL/min (by C-G formula based on SCr of 0.86 mg/dL).  Assessment: 34 y.o. female with chest pain for heparin   Goal of Therapy:  Heparin level 0.3-0.7 units/ml Monitor platelets by anticoagulation protocol: Yes   Plan:  Heparin 2000 units IV bolus, then increase heparin 1450 units/hr Check heparin level in 6 hours.  31, PharmD, BCPS    02/25/2020,4:11 AM

## 2020-02-26 ENCOUNTER — Ambulatory Visit: Payer: BC Managed Care – PPO | Admitting: Family Medicine

## 2020-02-26 DIAGNOSIS — R072 Precordial pain: Secondary | ICD-10-CM

## 2020-02-26 LAB — CBC
HCT: 38.8 % (ref 36.0–46.0)
Hemoglobin: 12.2 g/dL (ref 12.0–15.0)
MCH: 26.6 pg (ref 26.0–34.0)
MCHC: 31.4 g/dL (ref 30.0–36.0)
MCV: 84.5 fL (ref 80.0–100.0)
Platelets: 324 10*3/uL (ref 150–400)
RBC: 4.59 MIL/uL (ref 3.87–5.11)
RDW: 15.8 % — ABNORMAL HIGH (ref 11.5–15.5)
WBC: 14 10*3/uL — ABNORMAL HIGH (ref 4.0–10.5)
nRBC: 0 % (ref 0.0–0.2)

## 2020-02-26 LAB — BASIC METABOLIC PANEL
Anion gap: 10 (ref 5–15)
BUN: 11 mg/dL (ref 6–20)
CO2: 25 mmol/L (ref 22–32)
Calcium: 8.5 mg/dL — ABNORMAL LOW (ref 8.9–10.3)
Chloride: 101 mmol/L (ref 98–111)
Creatinine, Ser: 0.78 mg/dL (ref 0.44–1.00)
GFR calc Af Amer: >60 mL/min (ref >60)
GFR calc non Af Amer: >60 mL/min (ref >60)
Glucose, Bld: 120 mg/dL — ABNORMAL HIGH (ref 70–99)
Potassium: 3.4 mmol/L — ABNORMAL LOW (ref 3.5–5.1)
Sodium: 136 mmol/L (ref 135–145)

## 2020-02-26 MED ORDER — POTASSIUM CHLORIDE CRYS ER 20 MEQ PO TBCR
20.0000 meq | EXTENDED_RELEASE_TABLET | Freq: Once | ORAL | Status: AC
  Administered 2020-02-26: 20 meq via ORAL
  Filled 2020-02-26: qty 1

## 2020-02-26 MED ORDER — LABETALOL HCL 200 MG PO TABS
300.0000 mg | ORAL_TABLET | Freq: Three times a day (TID) | ORAL | Status: DC
  Administered 2020-02-26: 300 mg via ORAL
  Filled 2020-02-26 (×2): qty 2

## 2020-02-26 MED ORDER — HYDRALAZINE HCL 20 MG/ML IJ SOLN
10.0000 mg | Freq: Three times a day (TID) | INTRAMUSCULAR | Status: DC | PRN
  Administered 2020-02-26: 10 mg via INTRAVENOUS
  Filled 2020-02-26: qty 1

## 2020-02-26 MED ORDER — SPIRONOLACTONE 25 MG PO TABS
25.0000 mg | ORAL_TABLET | Freq: Every day | ORAL | Status: DC
  Administered 2020-02-26 – 2020-02-27 (×2): 25 mg via ORAL
  Filled 2020-02-26 (×2): qty 1

## 2020-02-26 MED ORDER — NICARDIPINE HCL IN NACL 20-0.86 MG/200ML-% IV SOLN
3.0000 mg/h | INTRAVENOUS | Status: DC
  Administered 2020-02-26: 3 mg/h via INTRAVENOUS
  Filled 2020-02-26: qty 200

## 2020-02-26 NOTE — Progress Notes (Signed)
PROGRESS NOTE    Chelsea Petty  UYQ:[id number] DOB: 07-02-1986 DOA: 02/24/2020 PCP: Patient, No Pcp Per   Brief Narrative:  Per HPI: DOMNIQUE VANEGAS is a 34 y.o. female with medical history significant for hypertension, hyperlipidemia, CAD s/p 2 stent placement, obesity and tobacco abuse who presents to the emergency department due to chest pain which started around 3 AM.  Patient states that chest pain started around left shoulder and radiates to left upper chest area, chest pain was described as dull in nature and was non-reproducible. It was initially intermittent till 1 PM, however, the chest pain became more constant after 1pm and she presents to the emergency department for further evaluation.  There was no known alleviating/aggravating factor.  Patient has had elevated blood pressure which has been difficult to control she was currently taking labetalol 300 mg 3 times daily, she was on Lasix in the past, but has not been taking this for some time.  Patient states that she is following up with her PCP regarding better control of her elevated blood pressure.  ED Course:  In the emergency department, BP was elevated at 215/133, she was tachypneic, but all other vital signs were within normal range.  Work-up in the ED showed normal CBC and BMP except for leukocytosis.  Troponin I 15>27.  Chest x-ray showed cardiomegaly and mild bilateral lower lung predominant interstitial opacities suspected to be pulmonary edema.  She was treated with labetalol 300 mg p.o. x1 and Lasix 40 mg p.o. x1 without much improvement in the blood pressure, patient was then started on IV nitroglycerin drip.  IV heparin drip was started day elevated troponin.  Hospitalist was asked to admit patient for further evaluation management.  Assessment & Plan:   Principal Problem:   Chest pain Active Problems:   CAD (coronary artery disease)   Essential hypertension   Obesity   Tobacco abuse   Hypertensive urgency    Pulmonary edema   Atypical chest pain with elevated troponin likely secondary to demand ischemia -Appreciate cardiology evaluation -Troponins with flat trend and heparin drip has been discontinued -2D echocardiogram performed with results demonstrating no wall motion abnormalities  -BNP elevation with some pulmonary edema noted as well for which IV Lasix 40 mg twice daily has been started per cardiology.   -Follow I's and O's and daily weights. -continue low dose sodium diet  Hypertensive crisis -Poorly controlled hypertension at home with medication noncompliance -She reportedly discontinued her medications 3 years ago and has been having trouble with control after reinitiation recently. -Continue to weaned off NTG drip -will continue labetalol and as per cardiology rec's aldactone and losartan   Hypokalemia -K3.4 -will give low dose potassium orally. -follow BMET in am  CAD with prior stents -Continue labetalol 3 times daily -Aspirin and Crestor resumed by cardiology -no CP currently.  Dyslipidemia -LDL elevated to 156 last month -Crestor 20 mg daily started per cardiology -heart healthy diet ordered.   Morbid obesity -Will need lifestyle changes in outpatient setting -Body mass index is 47.23 kg/m.  Tobacco abuse -Counseled on cessation -Nicotine patch ordered   DVT prophylaxis: lovenox Code Status: Full code Family Communication: None at bedside Disposition Plan:   Status is: Inpatient  Dispo: The patient is from: Home              Anticipated d/c is to: Home              Anticipated d/c date is: 1 day hopefully home on  02/27/20              Patient currently is not medically stable to d/c. BP is still elevated and labile, requiring NTG drip. Will continue adjusting antihypertensive agents and stop drip.  Consultants:   Cardiology  Procedures:   2D echocardiogram 7/6  1. Left ventricular ejection fraction, by estimation, is 55 to 60%. The  left  ventricle has normal function. The left ventricle has no regional  wall motion abnormalities. There is moderate left ventricular hypertrophy.  Left ventricular diastolic  parameters are consistent with Grade III diastolic dysfunction  (restrictive).  2. Right ventricular systolic function is normal. The right ventricular  size is normal. Tricuspid regurgitation signal is inadequate for assessing  PA pressure.  3. Left atrial size was moderately dilated.  4. The mitral valve is grossly normal. Mild mitral valve regurgitation.  5. The aortic valve was not well visualized, mild annular calcification.  Aortic valve regurgitation is not visualized.  6. The inferior vena cava is dilated in size with >50% respiratory  variability, suggesting right atrial pressure of 8 mmHg.   Antimicrobials:   None   Subjective: Complaining of headache. No chest pain, no nausea, no vomiting.  Patient still with uncontrolled BP, labile and high; NTG drip in the process of being weaned off. not  Objective: Vitals:   02/26/20 1700 02/26/20 1715 02/26/20 1730 02/26/20 1745  BP: (!) 176/96 (!) 234/126 (!) 209/129 (!) 219/120  Pulse:   81 80  Resp: (!) 7     Temp:      TempSrc:      SpO2:   96% 96%  Weight:      Height:        Intake/Output Summary (Last 24 hours) at 02/26/2020 1807 Last data filed at 02/26/2020 1500 Gross per 24 hour  Intake 858.12 ml  Output 1250 ml  Net -391.88 ml   Filed Weights   02/24/20 1604 02/25/20 0757 02/26/20 0500  Weight: 122 kg 123.5 kg 124.8 kg    Examination: General exam: Alert, awake, oriented x 3; reports improvement in her headache after decreasing nitroglycerin drip.  Blood pressure is still low I will elevated visit no chest pain, no nausea vomiting Respiratory system: Clear to auscultation. Respiratory effort normal. Cardiovascular system:RRR. No murmurs, rubs, gallops. Gastrointestinal system: Abdomen is nondistended, soft and nontender. No organomegaly  or masses felt. Normal bowel sounds heard. Central nervous system: Alert and oriented. No focal neurological deficits. Extremities: No C/C/E Skin: No rashes, lesions or ulcers Psychiatry: Judgement and insight appear normal. Mood & affect appropriate.    Data Reviewed: I have personally reviewed following labs and imaging studies  CBC: Recent Labs  Lab 02/24/20 1628 02/25/20 0336 02/26/20 0342  WBC 13.0* 12.9* 14.0*  HGB 13.4 12.3 12.2  HCT 43.2 39.1 38.8  MCV 84.7 84.6 84.5  PLT 349 334 324   Basic Metabolic Panel: Recent Labs  Lab 02/24/20 1628 02/26/20 0342  NA 139 136  K 3.7 3.4*  CL 106 101  CO2 24 25  GLUCOSE 103* 120*  BUN 15 11  CREATININE 0.86 0.78  CALCIUM 8.8* 8.5*   GFR: Estimated Creatinine Clearance: 128.1 mL/min (by C-G formula based on SCr of 0.78 mg/dL).  Coagulation Profile: Recent Labs  Lab 02/24/20 1628  INR 1.1     Recent Results (from the past 240 hour(s))  SARS Coronavirus 2 by RT PCR (hospital order, performed in [facility] [place] hospital lab) Nasopharyngeal Nasopharyngeal Swab  Status: None   Collection Time: 02/24/20  7:52 PM   Specimen: Nasopharyngeal Swab  Result Value Ref Range Status   SARS Coronavirus 2 NEGATIVE NEGATIVE Final    Comment: (NOTE) SARS-CoV-2 target nucleic acids are NOT DETECTED.  The SARS-CoV-2 RNA is generally detectable in upper and lower respiratory specimens during the acute phase of infection. The lowest concentration of SARS-CoV-2 viral copies this assay can detect is 250 copies / mL. A negative result does not preclude SARS-CoV-2 infection and should not be used as the sole basis for treatment or other patient management decisions.  A negative result may occur with improper specimen collection / handling, submission of specimen other than nasopharyngeal swab, presence of viral mutation(s) within the areas targeted by this assay, and inadequate number of viral copies (<250 copies / mL). A negative  result must be combined with clinical observations, patient history, and epidemiological information.  Fact Sheet for Patients:   BoilerBrush.com.cy  Fact Sheet for Healthcare Providers: https://pope.com/  This test is not yet approved or  cleared by the [place] FDA and has been authorized for detection and/or diagnosis of SARS-CoV-2 by FDA under an Emergency Use Authorization (EUA).  This EUA will remain in effect (meaning this test can be used) for the duration of the COVID-19 declaration under Section 564(b)(1) of the Act, 21 U.S.C. section 360bbb-3(b)(1), unless the authorization is terminated or revoked sooner.  Performed at [facility], [address] [place], [place] [address]   MRSA PCR Screening     Status: None   Collection Time: 02/25/20 10:46 AM   Specimen: Nasal Mucosa; Nasopharyngeal  Result Value Ref Range Status   MRSA by PCR NEGATIVE NEGATIVE Final    Comment:        The GeneXpert MRSA Assay (FDA approved for NASAL specimens only), is one component of a comprehensive MRSA colonization surveillance program. It is not intended to diagnose MRSA infection nor to guide or monitor treatment for MRSA infections. Performed at [facility], [address], [place], [place] [address]      Radiology Studies: ECHOCARDIOGRAM COMPLETE  Result Date: 02/25/2020    ECHOCARDIOGRAM REPORT   Patient Name:   MAURISSA AMBROSE Date of Exam: 02/25/2020 Medical Rec #:  [id number]           Height:       64.0 in Accession #:    [id number]          Weight:       272.3 lb Date of Birth:  [birth year]            BSA:          2.230 m Patient Age:    33 years            BP:           167/96 mmHg Patient Gender: F                   HR:           84 bpm. [facility] Procedure: 2D Echo, Cardiac Doppler and Color Doppler Indications:    CHF-Acute Diastolic 428.31 / I50.31  History:        Patient has no prior history of  Echocardiogram examinations.                 CAD; Risk Factors:Hypertension. Obesity,Tobacco                 abuse,Palpitations.  Sonographer:    Celesta Gentile RCS Referring Phys: [id number] OLADAPO ADEFESO IMPRESSIONS  1. Left ventricular ejection fraction, by estimation, is 55 to 60%. The left ventricle has normal function. The left ventricle has no regional wall motion abnormalities. There is moderate left ventricular hypertrophy. Left ventricular diastolic parameters are consistent with Grade III diastolic dysfunction (restrictive).  2. Right ventricular systolic function is normal. The right ventricular size is normal. Tricuspid regurgitation signal is inadequate for assessing PA pressure.  3. Left atrial size was moderately dilated.  4. The mitral valve is grossly normal. Mild mitral valve regurgitation.  5. The aortic valve was not well visualized, mild annular calcification. Aortic valve regurgitation is not visualized.  6. The inferior vena cava is dilated in size with >50% respiratory variability, suggesting right atrial pressure of 8 mmHg. FINDINGS  Left Ventricle: Left ventricular ejection fraction, by estimation, is 55 to 60%. The left ventricle has normal function. The left ventricle has no regional wall motion abnormalities. The left ventricular internal cavity size was normal in size. There is  moderate left ventricular hypertrophy. Left ventricular diastolic parameters are consistent with Grade III diastolic dysfunction (restrictive). Right Ventricle: The right ventricular size is normal. No increase in right ventricular wall thickness. Right ventricular systolic function is normal. Tricuspid regurgitation signal is inadequate for assessing PA pressure. Left Atrium: Left atrial size was moderately dilated. Right Atrium: Right atrial size was normal in size. Pericardium: There is no evidence of pericardial effusion. Mitral Valve: The mitral valve is grossly normal. Mild mitral valve regurgitation.  Tricuspid Valve: The tricuspid valve is grossly normal. Tricuspid valve regurgitation is trivial. Aortic Valve: The aortic valve was not well visualized. Aortic valve regurgitation is not visualized. Mild aortic valve annular calcification. Pulmonic Valve: The pulmonic valve was grossly normal. Pulmonic valve regurgitation is trivial. Aorta: The aortic root is normal in size and structure. Venous: The inferior vena cava is dilated in size with greater than 50% respiratory variability, suggesting right atrial pressure of 8 mmHg. IAS/Shunts: No atrial level shunt detected by color flow Doppler.  LEFT VENTRICLE PLAX 2D LVIDd:         5.28 cm  Diastology LVIDs:         3.04 cm  LV e' lateral:   6.64 cm/s LV PW:         1.42 cm  LV E/e' lateral: 20.9 LV IVS:        1.35 cm  LV e' medial:    6.53 cm/s LVOT diam:     1.70 cm  LV E/e' medial:  21.3 LV SV:         52 LV SV Index:   24 LVOT Area:     2.27 cm  RIGHT VENTRICLE RV S prime:     13.90 cm/s TAPSE (M-mode): 2.3 cm LEFT ATRIUM            Index       RIGHT ATRIUM           Index LA diam:      4.80 cm  2.15 cm/m  RA Area:     16.40 cm LA Vol (A4C): 115.0 ml 51.56 ml/m RA Volume:   38.80 ml  17.40 ml/m  AORTIC VALVE LVOT Vmax:   107.00 cm/s LVOT Vmean:  79.300 cm/s LVOT VTI:    0.231 m  AORTA Ao Root diam: 3.10 cm MITRAL VALVE MV Area (PHT): 3.65 cm     SHUNTS MV Decel Time: 208 msec  Systemic VTI:  0.23 m MV E velocity: 139.00 cm/s  Systemic Diam: 1.70 cm MV A velocity: 34.50 cm/s MV E/A ratio:  4.03 Nona DellSamuel Mcdowell MD Electronically signed by Nona DellSamuel Mcdowell MD Signature Date/Time: 02/25/2020/1:43:11 PM    Final    Scheduled Meds: . aspirin EC  81 mg Oral Daily  . Chlorhexidine Gluconate Cloth  6 each Topical Daily  . enoxaparin (LOVENOX) injection  60 mg Subcutaneous Q24H  . FLUoxetine  20 mg Oral Daily  . furosemide  40 mg Intravenous Q12H  . labetalol  300 mg Oral TID  . losartan  50 mg Oral Daily  . nicotine  21 mg Transdermal Daily  .  rosuvastatin  20 mg Oral q1800  . spironolactone  25 mg Oral Daily   Continuous Infusions: . nitroGLYCERIN Stopped (02/26/20 1450)     LOS: 2 days    Time spent: 35 minutes    Vassie Lollarlos Lorell Thibodaux, DO [facility]  If 7PM-7AM, please contact night-coverage [email or web address] 02/26/2020, 6:07 PM

## 2020-02-26 NOTE — Progress Notes (Signed)
Pt off nitro drip with rapidly rising BP again.  Pt does not want nitro again due to reported excruciating headache.  Discussed with RN, will start with Cardene drip and monitor progress.

## 2020-02-26 NOTE — Progress Notes (Signed)
Progress Note  Patient Name: Chelsea Petty Date of Encounter: 02/26/2020  Primary Cardiologist:  New to [facility]  Subjective   Complains of headache, currently on 75 mcg of IV nitroglycerin.  No active chest pain or shortness of breath.  States that she slept reasonably well.  Inpatient Medications    Scheduled Meds: . aspirin EC  81 mg Oral Daily  . Chlorhexidine Gluconate Cloth  6 each Topical Daily  . enoxaparin (LOVENOX) injection  60 mg Subcutaneous Q24H  . FLUoxetine  20 mg Oral Daily  . furosemide  40 mg Intravenous Q12H  . labetalol  300 mg Oral BID  . losartan  50 mg Oral Daily  . nicotine  21 mg Transdermal Daily  . rosuvastatin  20 mg Oral q1800   Continuous Infusions: . nitroGLYCERIN 75 mcg/min (02/26/20 0600)   PRN Meds: acetaminophen, labetalol, ondansetron (ZOFRAN) IV   Vital Signs    Vitals:   02/26/20 0645 02/26/20 0700 02/26/20 0733 02/26/20 0745  BP: (!) 155/95 (!) 162/93 (!) 175/115 (!) 179/98  Pulse: 71 72 79 77  Resp: (!) 23 (!) 22 17 (!) 24  Temp:      TempSrc:      SpO2: 91% 92% 97% 97%  Weight:      Height:        Intake/Output Summary (Last 24 hours) at 02/26/2020 0829 Last data filed at 02/26/2020 0600 Gross per 24 hour  Intake 1184.05 ml  Output --  Net 1184.05 ml   Filed Weights   02/24/20 1604 02/25/20 0757 02/26/20 0500  Weight: 122 kg 123.5 kg 124.8 kg    Telemetry    Sinus rhythm.  Personally reviewed.  ECG    An ECG dated 02/24/2020 was personally reviewed today and demonstrated:  Sinus rhythm with IVCD.  Physical Exam   GEN: No acute distress.   Neck: No JVD. Cardiac: RRR, no murmur, rub, or gallop.  Respiratory: Nonlabored. Clear to auscultation bilaterally. GI: Soft, nontender, bowel sounds present. MS: No edema; No deformity. Neuro:  Nonfocal. Psych: Alert and oriented x 3. Normal affect.  Labs    Chemistry Recent Labs  Lab 02/24/20 1628 02/26/20 0342  NA 139 136  K 3.7 3.4*  CL 106 101  CO2 24 25   GLUCOSE 103* 120*  BUN 15 11  CREATININE 0.86 0.78  CALCIUM 8.8* 8.5*  GFRNONAA >60 >60  GFRAA >60 >60  ANIONGAP 9 10     Hematology Recent Labs  Lab 02/24/20 1628 02/25/20 0336 02/26/20 0342  WBC 13.0* 12.9* 14.0*  RBC 5.10 4.62 4.59  HGB 13.4 12.3 12.2  HCT 43.2 39.1 38.8  MCV 84.7 84.6 84.5  MCH 26.3 26.6 26.6  MCHC 31.0 31.5 31.4  RDW 15.6* 15.8* 15.8*  PLT 349 334 324    Cardiac Enzymes Recent Labs  Lab 02/24/20 1628 02/24/20 1812 02/25/20 0741 02/25/20 0936  TROPONINIHS 15 27* 49* 45*    BNP Recent Labs  Lab 02/24/20 1628  BNP 334.0*     Radiology    DG Chest 2 View  Result Date: 02/24/2020 CLINICAL DATA:  Chest pain and shortness of breath EXAM: CHEST - 2 VIEW COMPARISON:  Chest radiograph dated 02/03/2008 FINDINGS: The heart is enlarged. Mild bilateral lower lung predominant interstitial opacities are noted. There is no pleural effusion or pneumothorax. The osseous structures are intact. IMPRESSION: 1. Mild bilateral lower lung predominant interstitial opacities likely represent pulmonary edema. 2. Cardiomegaly. Electronically Signed   By: Romona Curls  M.D.   On: 02/24/2020 16:51   ECHOCARDIOGRAM COMPLETE  Result Date: 02/25/2020    ECHOCARDIOGRAM REPORT   Patient Name:   Chelsea Petty Date of Exam: 02/25/2020 Medical Rec #:  [id number]           Height:       64.0 in Accession #:    [id number]          Weight:       272.3 lb Date of Birth:  08-06-1986            BSA:          2.230 m Patient Age:    33 years            BP:           167/96 mmHg Patient Gender: F                   HR:           84 bpm. [facility] Procedure: 2D Echo, Cardiac Doppler and Color Doppler Indications:    CHF-Acute Diastolic 428.31 / I50.31  History:        Patient has no prior history of Echocardiogram examinations.                 CAD; Risk Factors:Hypertension. Obesity,Tobacco                 abuse,Palpitations.  Sonographer:    Celesta Gentile RCS Referring  Phys: [id number] OLADAPO ADEFESO IMPRESSIONS  1. Left ventricular ejection fraction, by estimation, is 55 to 60%. The left ventricle has normal function. The left ventricle has no regional wall motion abnormalities. There is moderate left ventricular hypertrophy. Left ventricular diastolic parameters are consistent with Grade III diastolic dysfunction (restrictive).  2. Right ventricular systolic function is normal. The right ventricular size is normal. Tricuspid regurgitation signal is inadequate for assessing PA pressure.  3. Left atrial size was moderately dilated.  4. The mitral valve is grossly normal. Mild mitral valve regurgitation.  5. The aortic valve was not well visualized, mild annular calcification. Aortic valve regurgitation is not visualized.  6. The inferior vena cava is dilated in size with >50% respiratory variability, suggesting right atrial pressure of 8 mmHg. FINDINGS  Left Ventricle: Left ventricular ejection fraction, by estimation, is 55 to 60%. The left ventricle has normal function. The left ventricle has no regional wall motion abnormalities. The left ventricular internal cavity size was normal in size. There is  moderate left ventricular hypertrophy. Left ventricular diastolic parameters are consistent with Grade III diastolic dysfunction (restrictive). Right Ventricle: The right ventricular size is normal. No increase in right ventricular wall thickness. Right ventricular systolic function is normal. Tricuspid regurgitation signal is inadequate for assessing PA pressure. Left Atrium: Left atrial size was moderately dilated. Right Atrium: Right atrial size was normal in size. Pericardium: There is no evidence of pericardial effusion. Mitral Valve: The mitral valve is grossly normal. Mild mitral valve regurgitation. Tricuspid Valve: The tricuspid valve is grossly normal. Tricuspid valve regurgitation is trivial. Aortic Valve: The aortic valve was not well visualized. Aortic valve  regurgitation is not visualized. Mild aortic valve annular calcification. Pulmonic Valve: The pulmonic valve was grossly normal. Pulmonic valve regurgitation is trivial. Aorta: The aortic root is normal in size and structure. Venous: The inferior vena cava is dilated in size with greater than 50% respiratory variability, suggesting right atrial pressure of 8 mmHg. IAS/Shunts: No atrial level shunt  detected by color flow Doppler.  LEFT VENTRICLE PLAX 2D LVIDd:         5.28 cm  Diastology LVIDs:         3.04 cm  LV e' lateral:   6.64 cm/s LV PW:         1.42 cm  LV E/e' lateral: 20.9 LV IVS:        1.35 cm  LV e' medial:    6.53 cm/s LVOT diam:     1.70 cm  LV E/e' medial:  21.3 LV SV:         52 LV SV Index:   24 LVOT Area:     2.27 cm  RIGHT VENTRICLE RV S prime:     13.90 cm/s TAPSE (M-mode): 2.3 cm LEFT ATRIUM            Index       RIGHT ATRIUM           Index LA diam:      4.80 cm  2.15 cm/m  RA Area:     16.40 cm LA Vol (A4C): 115.0 ml 51.56 ml/m RA Volume:   38.80 ml  17.40 ml/m  AORTIC VALVE LVOT Vmax:   107.00 cm/s LVOT Vmean:  79.300 cm/s LVOT VTI:    0.231 m  AORTA Ao Root diam: 3.10 cm MITRAL VALVE MV Area (PHT): 3.65 cm     SHUNTS MV Decel Time: 208 msec     Systemic VTI:  0.23 m MV E velocity: 139.00 cm/s  Systemic Diam: 1.70 cm MV A velocity: 34.50 cm/s MV E/A ratio:  4.03 Nona Dell MD Electronically signed by Nona Dell MD Signature Date/Time: 02/25/2020/1:43:11 PM    Final     Patient Profile     34 y.o. female with past medical history of CAD (s/p NSTEMI in 2014 with DES to D1 and DES to PL branch), HTN, HLD and obesity presenting with hypertensive urgency.  Assessment & Plan    1.  Hypertensive urgency.  She is currently on labetalol 300 mg twice daily and started on losartan 50 mg daily as well.  Remains on IV nitroglycerin at 75 mcg.  Follow-up blood pressure this morning is 160s over 80s.  2.  CAD status post DES to the first diagonal and posterior lateral branch in  2014 reviewed.  No active chest pain this morning.  High-sensitivity troponin I levels are not suggestive of ACS.  ECG nonspecific.  Follow-up echocardiogram performed yesterday reveals LVEF 55 to 60% with moderate LVH and restrictive diastolic filling pattern.  3.  Mixed hyperlipidemia, recent LDL 156.  She has been started on Crestor with goal to get LDL under 70.  Discussed with patient and nursing this morning.  Hopefully can wean IV nitroglycerin today.  Add Aldactone 25 mg daily to current regimen including labetalol and losartan.  Continue IV Lasix today.  Follow-up BMET in a.m.  If headache does not improve with weaning nitroglycerin, consider head CT.  Signed, Nona Dell, MD  02/26/2020, 8:29 AM

## 2020-02-26 NOTE — TOC Progression Note (Signed)
Transition of Care [facility]) - Progression Note    Patient Details  Name: Chelsea Petty MRN: [id number] Date of Birth: [birth year]  Transition of [facility]) CM/SW Contact  Leitha Bleak, RN Phone Number: 02/26/2020, 11:40 AM  Clinical Narrative:   Patient admitted with chest pain. PT had no recommendation. Patient does not have PCP. TOC provided a PCP list.

## 2020-02-27 DIAGNOSIS — I5033 Acute on chronic diastolic (congestive) heart failure: Secondary | ICD-10-CM

## 2020-02-27 LAB — CBC
HCT: 44.9 % (ref 36.0–46.0)
Hemoglobin: 13.8 g/dL (ref 12.0–15.0)
MCH: 25.9 pg — ABNORMAL LOW (ref 26.0–34.0)
MCHC: 30.7 g/dL (ref 30.0–36.0)
MCV: 84.2 fL (ref 80.0–100.0)
Platelets: 353 10*3/uL (ref 150–400)
RBC: 5.33 MIL/uL — ABNORMAL HIGH (ref 3.87–5.11)
RDW: 15.6 % — ABNORMAL HIGH (ref 11.5–15.5)
WBC: 12.9 10*3/uL — ABNORMAL HIGH (ref 4.0–10.5)
nRBC: 0 % (ref 0.0–0.2)

## 2020-02-27 LAB — GLUCOSE, CAPILLARY: Glucose-Capillary: 180 mg/dL — ABNORMAL HIGH (ref 70–99)

## 2020-02-27 MED ORDER — SPIRONOLACTONE 25 MG PO TABS
25.0000 mg | ORAL_TABLET | Freq: Two times a day (BID) | ORAL | Status: DC
  Administered 2020-02-27 – 2020-02-28 (×2): 25 mg via ORAL
  Filled 2020-02-27 (×2): qty 1

## 2020-02-27 MED ORDER — LOSARTAN POTASSIUM 50 MG PO TABS
100.0000 mg | ORAL_TABLET | Freq: Every day | ORAL | Status: DC
  Administered 2020-02-27 – 2020-02-28 (×2): 100 mg via ORAL
  Filled 2020-02-27: qty 2

## 2020-02-27 MED ORDER — LABETALOL HCL 200 MG PO TABS
400.0000 mg | ORAL_TABLET | Freq: Two times a day (BID) | ORAL | Status: DC
  Administered 2020-02-27 – 2020-02-28 (×3): 400 mg via ORAL
  Filled 2020-02-27 (×2): qty 2

## 2020-02-27 NOTE — Progress Notes (Addendum)
Progress Note  Patient Name: Chelsea Petty Date of Encounter: 02/27/2020  Primary Cardiologist: Nona Dell, MD   Subjective   She reports her headache resolved following discontinuation of IV NTG. No recurrent chest pain or dyspnea. Feels back to baseline. Anxious to go home.   Inpatient Medications    Scheduled Meds:  aspirin EC  81 mg Oral Daily   Chlorhexidine Gluconate Cloth  6 each Topical Daily   enoxaparin (LOVENOX) injection  60 mg Subcutaneous Q24H   FLUoxetine  20 mg Oral Daily   labetalol  400 mg Oral BID   losartan  100 mg Oral Daily   nicotine  21 mg Transdermal Daily   rosuvastatin  20 mg Oral q1800   spironolactone  25 mg Oral Daily   Continuous Infusions:  niCARDipine Stopped (02/27/20 0314)   nitroGLYCERIN Stopped (02/26/20 1450)   PRN Meds: acetaminophen, hydrALAZINE, ondansetron (ZOFRAN) IV   Vital Signs    Vitals:   02/27/20 0645 02/27/20 0700 02/27/20 0715 02/27/20 0739  BP: (!) 167/95 (!) 159/91 (!) 172/100   Pulse: 65 73 68 77  Resp: 19 (!) 26 18 19   Temp:    98.1 F (36.7 C)  TempSrc:    Oral  SpO2: 93% 94% 94% 97%  Weight:      Height:        Intake/Output Summary (Last 24 hours) at 02/27/2020 0832 Last data filed at 02/27/2020 04/29/2020 Gross per 24 hour  Intake 315.37 ml  Output 1250 ml  Net -934.63 ml    Last 3 Weights 02/26/2020 02/25/2020 02/24/2020  Weight (lbs) 275 lb 2.2 oz 272 lb 4.3 oz 269 lb  Weight (kg) 124.8 kg 123.5 kg 122.018 kg      Telemetry    NSR, HR in 60's to 70's. No significant arrhythmias.  - Personally Reviewed  ECG    No new tracings.   Physical Exam   General: Well developed, well nourished, female appearing in no acute distress. Head: Normocephalic, atraumatic.  Neck: Supple without bruits, JVD not elevated. Lungs:  Resp regular and unlabored, CTA without wheezing or rales. Heart: RRR, S1, S2, no S3, S4, or murmur; no rub. Abdomen: Soft, non-tender, non-distended with normoactive  bowel sounds. No hepatomegaly. No rebound/guarding. No obvious abdominal masses. Extremities: No clubbing, cyanosis, or edema. Distal pedal pulses are 2+ bilaterally. Neuro: Alert and oriented X 3. Moves all extremities spontaneously. Psych: Normal affect.  Labs    Chemistry Recent Labs  Lab 02/24/20 1628 02/26/20 0342  NA 139 136  K 3.7 3.4*  CL 106 101  CO2 24 25  GLUCOSE 103* 120*  BUN 15 11  CREATININE 0.86 0.78  CALCIUM 8.8* 8.5*  GFRNONAA >60 >60  GFRAA >60 >60  ANIONGAP 9 10     Hematology Recent Labs  Lab 02/25/20 0336 02/26/20 0342 02/27/20 0402  WBC 12.9* 14.0* 12.9*  RBC 4.62 4.59 5.33*  HGB 12.3 12.2 13.8  HCT 39.1 38.8 44.9  MCV 84.6 84.5 84.2  MCH 26.6 26.6 25.9*  MCHC 31.5 31.4 30.7  RDW 15.8* 15.8* 15.6*  PLT 334 324 353    Cardiac EnzymesNo results for input(s): TROPONINI in the last 168 hours. No results for input(s): TROPIPOC in the last 168 hours.   BNP Recent Labs  Lab 02/24/20 1628  BNP 334.0*     DDimer No results for input(s): DDIMER in the last 168 hours.   Radiology    ECHOCARDIOGRAM COMPLETE  Result Date: 02/25/2020  ECHOCARDIOGRAM REPORT   Patient Name:   Chelsea Petty Date of Exam: 02/25/2020 Medical Rec #:  [id number]           Height:       64.0 in Accession #:    [id number]          Weight:       272.3 lb Date of Birth:  06/15/1986            BSA:          2.230 m Patient Age:    33 years            BP:           167/96 mmHg Patient Gender: F                   HR:           84 bpm. [facility] Procedure: 2D Echo, Cardiac Doppler and Color Doppler Indications:    CHF-Acute Diastolic 428.31 / I50.31  History:        Patient has no prior history of Echocardiogram examinations.                 CAD; Risk Factors:Hypertension. Obesity,Tobacco                 abuse,Palpitations.  Sonographer:    Celesta GentileBernard White RCS Referring Phys: [id number] OLADAPO ADEFESO IMPRESSIONS  1. Left ventricular ejection fraction, by estimation,  is 55 to 60%. The left ventricle has normal function. The left ventricle has no regional wall motion abnormalities. There is moderate left ventricular hypertrophy. Left ventricular diastolic parameters are consistent with Grade III diastolic dysfunction (restrictive).  2. Right ventricular systolic function is normal. The right ventricular size is normal. Tricuspid regurgitation signal is inadequate for assessing PA pressure.  3. Left atrial size was moderately dilated.  4. The mitral valve is grossly normal. Mild mitral valve regurgitation.  5. The aortic valve was not well visualized, mild annular calcification. Aortic valve regurgitation is not visualized.  6. The inferior vena cava is dilated in size with >50% respiratory variability, suggesting right atrial pressure of 8 mmHg. FINDINGS  Left Ventricle: Left ventricular ejection fraction, by estimation, is 55 to 60%. The left ventricle has normal function. The left ventricle has no regional wall motion abnormalities. The left ventricular internal cavity size was normal in size. There is  moderate left ventricular hypertrophy. Left ventricular diastolic parameters are consistent with Grade III diastolic dysfunction (restrictive). Right Ventricle: The right ventricular size is normal. No increase in right ventricular wall thickness. Right ventricular systolic function is normal. Tricuspid regurgitation signal is inadequate for assessing PA pressure. Left Atrium: Left atrial size was moderately dilated. Right Atrium: Right atrial size was normal in size. Pericardium: There is no evidence of pericardial effusion. Mitral Valve: The mitral valve is grossly normal. Mild mitral valve regurgitation. Tricuspid Valve: The tricuspid valve is grossly normal. Tricuspid valve regurgitation is trivial. Aortic Valve: The aortic valve was not well visualized. Aortic valve regurgitation is not visualized. Mild aortic valve annular calcification. Pulmonic Valve: The pulmonic valve  was grossly normal. Pulmonic valve regurgitation is trivial. Aorta: The aortic root is normal in size and structure. Venous: The inferior vena cava is dilated in size with greater than 50% respiratory variability, suggesting right atrial pressure of 8 mmHg. IAS/Shunts: No atrial level shunt detected by color flow Doppler.  LEFT VENTRICLE PLAX 2D LVIDd:  5.28 cm  Diastology LVIDs:         3.04 cm  LV e' lateral:   6.64 cm/s LV PW:         1.42 cm  LV E/e' lateral: 20.9 LV IVS:        1.35 cm  LV e' medial:    6.53 cm/s LVOT diam:     1.70 cm  LV E/e' medial:  21.3 LV SV:         52 LV SV Index:   24 LVOT Area:     2.27 cm  RIGHT VENTRICLE RV S prime:     13.90 cm/s TAPSE (M-mode): 2.3 cm LEFT ATRIUM            Index       RIGHT ATRIUM           Index LA diam:      4.80 cm  2.15 cm/m  RA Area:     16.40 cm LA Vol (A4C): 115.0 ml 51.56 ml/m RA Volume:   38.80 ml  17.40 ml/m  AORTIC VALVE LVOT Vmax:   107.00 cm/s LVOT Vmean:  79.300 cm/s LVOT VTI:    0.231 m  AORTA Ao Root diam: 3.10 cm MITRAL VALVE MV Area (PHT): 3.65 cm     SHUNTS MV Decel Time: 208 msec     Systemic VTI:  0.23 m MV E velocity: 139.00 cm/s  Systemic Diam: 1.70 cm MV A velocity: 34.50 cm/s MV E/A ratio:  4.03 Nona Dell MD Electronically signed by Nona Dell MD Signature Date/Time: 02/25/2020/1:43:11 PM    Final     Cardiac Studies   Echocardiogram: 02/25/2020 IMPRESSIONS    1. Left ventricular ejection fraction, by estimation, is 55 to 60%. The  left ventricle has normal function. The left ventricle has no regional  wall motion abnormalities. There is moderate left ventricular hypertrophy.  Left ventricular diastolic  parameters are consistent with Grade III diastolic dysfunction  (restrictive).  2. Right ventricular systolic function is normal. The right ventricular  size is normal. Tricuspid regurgitation signal is inadequate for assessing  PA pressure.  3. Left atrial size was moderately dilated.  4. The  mitral valve is grossly normal. Mild mitral valve regurgitation.  5. The aortic valve was not well visualized, mild annular calcification.  Aortic valve regurgitation is not visualized.  6. The inferior vena cava is dilated in size with >50% respiratory  variability, suggesting right atrial pressure of 8 mmHg.   Patient Profile     34 y.o. female w/ PMH of CAD (s/p NSTEMI in 2014 with DES to D1 and DES to PL branch), HTN, HLD and obesity who is currently admitted for hypertensive urgency after having been off of her medications for several years.   Assessment & Plan    1. Hypertensive Urgency - SBP was initially > 200 and she was started on IV NTG at the time of admission. She has since been started on Labetalol 300mg  TID, Losartan 50mg  daily and Spironolactone 25mg  daily (added 02/26/2020). IV NTG was weaned and eventually discontinued. She developed recurrent HTN overnight and required IV Hydralazine and IV Labetalol with readings still remaining elevated. IV Nicardipine was eventually started and was later stopped at 0300. SBP in the 180's this AM.  - Discussed with Dr. and will titrate Losartan from 50mg  daily to 100mg  daily. Continue Spironolactone 25mg  daily and will adjust Labetalol to 400mg  BID for improved compliance in the outpatient setting. Can further titrate  Spironolactone or Labetalol today pending readings.   2. CAD - She is s/p NSTEMI in 2014 with DES to D1 and DES to PL branch. HS Troponin values were elevated to 15, 27 and 49 this admission which was likely due to demand ischemia in the setting of hypertensive urgency. Echo this admission shows a preserved EF of 55-60% with moderate LVH and Grade 3 DD but no regional WMA.  - Continue ASA, statin and BB therapy.   3. HLD - Labs last month showed LDL was elevated to 156. Goal LDL is less than 70. She has been started on Crestor 20mg  daily and will need repeat FLP and LFT's in 8 weeks.   4. Headaches - Now resolved  with discontinuation of IV NTG.   For questions or updates, please contact [facility] Please consult [email or web address] for contact info under Cardiology/STEMI.   , PA-C 8:32 AM 02/27/2020 Pager: [phone]   Attending note:  Patient seen and examined.  Reviewed interval hospital course and discussed the case with Ms. [phone] PA-C.  I agree with her above findings. Patient taken off IV nitroglycerin, temporarily placed on IV nicardipine by primary team due to significant elevation in blood pressure, planning further up titration of oral medications at this point.  Headache resolved off IV nitroglycerin.  She diuresed approximately 1000 cc net output last 24 hours.  Creatinine stable at 0.78.  Plan is to change Labetalol to 400 mg twice daily (would not use TID dosing as this will be more difficult in terms of compliance as an outpatient), increase Cozaar to 100 mg daily, continue Aldactone at current dose, and stop IV Lasix. Continue to follow - would not expect perfect blood pressure control as an inpatient, but trend should at least be more consistently improved prior to discharge. Her PCP will be Dr. Iran Ouch (states she already made contact with his practice).  Margo Aye, M.D., F.A.C.C.

## 2020-02-27 NOTE — Progress Notes (Signed)
PROGRESS NOTE    Chelsea Petty  QQI:[id number] DOB: 1986-04-23 DOA: 02/24/2020 PCP: Patient, No Pcp Per   Brief Narrative:  Per HPI: Chelsea Petty is a 34 y.o. female with medical history significant for hypertension, hyperlipidemia, CAD s/p 2 stent placement, obesity and tobacco abuse who presents to the emergency department due to chest pain which started around 3 AM.  Patient states that chest pain started around left shoulder and radiates to left upper chest area, chest pain was described as dull in nature and was non-reproducible. It was initially intermittent till 1 PM, however, the chest pain became more constant after 1pm and she presents to the emergency department for further evaluation.  There was no known alleviating/aggravating factor.  Patient has had elevated blood pressure which has been difficult to control she was currently taking labetalol 300 mg 3 times daily, she was on Lasix in the past, but has not been taking this for some time.  Patient states that she is following up with her PCP regarding better control of her elevated blood pressure.  ED Course:  In the emergency department, BP was elevated at 215/133, she was tachypneic, but all other vital signs were within normal range.  Work-up in the ED showed normal CBC and BMP except for leukocytosis.  Troponin I 15>27.  Chest x-ray showed cardiomegaly and mild bilateral lower lung predominant interstitial opacities suspected to be pulmonary edema.  She was treated with labetalol 300 mg p.o. x1 and Lasix 40 mg p.o. x1 without much improvement in the blood pressure, patient was then started on IV nitroglycerin drip.  IV heparin drip was started day elevated troponin.  Hospitalist was asked to admit patient for further evaluation management.  Assessment & Plan:   Principal Problem:   Chest pain Active Problems:   CAD (coronary artery disease)   Essential hypertension   Obesity   Tobacco abuse   Hypertensive urgency    Pulmonary edema   Atypical chest pain with elevated troponin likely secondary to demand ischemia -Appreciate cardiology evaluation -Troponins with flat trend and heparin drip has been discontinued. -2D echocardiogram performed with results demonstrating no wall motion abnormalities, grade 2 diastolic dysfunction and preserved ejection fraction. -BNP elevation with some pulmonary edema noted, in the setting of acute on chronic diastolic heart failure. (See below for details). -Follow I's and O's and daily weights. -continue low dose sodium diet  Hypertensive crisis -Poorly controlled hypertension at home with medication noncompliance -She reportedly discontinued her medications 3 years ago and has been having trouble with control after reinitiation recently. -Patient is off nitroglycerin drip; still with elevated blood pressure and ended requiring overnight the use of Cardene drip. -will continue labetalol (dose has been adjusted for better blood pressure control), continue losartan (dose also adjusted for further blood pressure management), continue spironolactone.   -As needed hydralazine has been added.  Acute on chronic diastolic heart failure -Continue to follow heart healthy diet -Follow daily weights -Status post IV diuresis and stabilization of volume status -Stopping Lasix currently. -Continue spironolactone 25 mg twice a day (looking into transition to 50 mg on daily basis at discharge).  Hypokalemia -Repleted -Currently receiving the spironolactone and losartan. -will give low dose potassium orally. -follow BMET in am  CAD with prior stents -Continue labetalol 3 times daily -Aspirin and Crestor resumed by cardiology -no CP currently and no shortness of breath..  Dyslipidemia -LDL elevated to 156 last month -Continue Crestor -heart healthy diet ordered.   Morbid obesity -Will  need lifestyle changes in outpatient setting -Body mass index is 47.23 kg/m.  Tobacco  abuse -Counseled on cessation -Nicotine patch ordered   DVT prophylaxis: lovenox Code Status: Full code Family Communication: None at bedside Disposition Plan:   Status is: Inpatient  Dispo: The patient is from: Home              Anticipated d/c is to: Home              Anticipated d/c date is: 1 day hopefully home on 02/27/20              Patient currently is not medically stable to d/c. BP is still elevated and labile, requiring Cardene drip overnight . Will continue adjusting antihypertensive agents and stop drip.  Consultants:   Cardiology  Procedures:   2D echocardiogram 7/6  1. Left ventricular ejection fraction, by estimation, is 55 to 60%. The  left ventricle has normal function. The left ventricle has no regional  wall motion abnormalities. There is moderate left ventricular hypertrophy.  Left ventricular diastolic  parameters are consistent with Grade III diastolic dysfunction  (restrictive).  2. Right ventricular systolic function is normal. The right ventricular  size is normal. Tricuspid regurgitation signal is inadequate for assessing  PA pressure.  3. Left atrial size was moderately dilated.  4. The mitral valve is grossly normal. Mild mitral valve regurgitation.  5. The aortic valve was not well visualized, mild annular calcification.  Aortic valve regurgitation is not visualized.  6. The inferior vena cava is dilated in size with >50% respiratory  variability, suggesting right atrial pressure of 8 mmHg.   Antimicrobials:   None   Subjective: No fever, no chest pain, no nausea, no vomiting.  Blood pressure continued to be elevated; overnight patient in the requiring Cardene drip to assist with blood pressure control.  Objective: Vitals:   02/27/20 1400 02/27/20 1500 02/27/20 1600 02/27/20 1616  BP: (!) 149/80 (!) 160/88 (!) 179/89   Pulse: 64 64 73 63  Resp: 14 (!) 28 17 (!) 28  Temp:    98.1 F (36.7 C)  TempSrc:    Oral  SpO2: 96% 96% 94%  96%  Weight:      Height:        Intake/Output Summary (Last 24 hours) at 02/27/2020 1643 Last data filed at 02/27/2020 0605 Gross per 24 hour  Intake 200.22 ml  Output --  Net 200.22 ml   Filed Weights   02/24/20 1604 02/25/20 0757 02/26/20 0500  Weight: 122 kg 123.5 kg 124.8 kg    Examination: General exam: Alert, awake, oriented x 3, no chest pain, no shortness of breath, no nausea or vomiting.  Reports no headaches currently. Respiratory system: Clear to auscultation. Respiratory effort normal. Cardiovascular system:RRR. No murmurs, rubs, gallops. Gastrointestinal system: Abdomen is nondistended, soft and nontender. No organomegaly or masses felt. Normal bowel sounds heard. Central nervous system: Alert and oriented. No focal neurological deficits. Extremities: No C/C/E, +pedal pulses Skin: No rashes, lesions or ulcers Psychiatry: Judgement and insight appear normal. Mood & affect appropriate.   Data Reviewed: I have personally reviewed following labs and imaging studies  CBC: Recent Labs  Lab 02/24/20 1628 02/25/20 0336 02/26/20 0342 02/27/20 0402  WBC 13.0* 12.9* 14.0* 12.9*  HGB 13.4 12.3 12.2 13.8  HCT 43.2 39.1 38.8 44.9  MCV 84.7 84.6 84.5 84.2  PLT 349 334 324 353   Basic Metabolic Panel: Recent Labs  Lab 02/24/20 1628 02/26/20 0342  NA  139 136  K 3.7 3.4*  CL 106 101  CO2 24 25  GLUCOSE 103* 120*  BUN 15 11  CREATININE 0.86 0.78  CALCIUM 8.8* 8.5*   GFR: Estimated Creatinine Clearance: 128.1 mL/min (by C-G formula based on SCr of 0.78 mg/dL).  Coagulation Profile: Recent Labs  Lab 02/24/20 1628  INR 1.1     Recent Results (from the past 240 hour(s))  SARS Coronavirus 2 by RT PCR (hospital order, performed in [facility] hospital lab) Nasopharyngeal Nasopharyngeal Swab     Status: None   Collection Time: 02/24/20  7:52 PM   Specimen: Nasopharyngeal Swab  Result Value Ref Range Status   SARS Coronavirus 2 NEGATIVE NEGATIVE Final     Comment: (NOTE) SARS-CoV-2 target nucleic acids are NOT DETECTED.  The SARS-CoV-2 RNA is generally detectable in upper and lower respiratory specimens during the acute phase of infection. The lowest concentration of SARS-CoV-2 viral copies this assay can detect is 250 copies / mL. A negative result does not preclude SARS-CoV-2 infection and should not be used as the sole basis for treatment or other patient management decisions.  A negative result may occur with improper specimen collection / handling, submission of specimen other than nasopharyngeal swab, presence of viral mutation(s) within the areas targeted by this assay, and inadequate number of viral copies (<250 copies / mL). A negative result must be combined with clinical observations, patient history, and epidemiological information.  Fact Sheet for Patients:   BoilerBrush.com.cy  Fact Sheet for Healthcare Providers: https://pope.com/  This test is not yet approved or  cleared by the [place] FDA and has been authorized for detection and/or diagnosis of SARS-CoV-2 by FDA under an Emergency Use Authorization (EUA).  This EUA will remain in effect (meaning this test can be used) for the duration of the COVID-19 declaration under Section 564(b)(1) of the Act, 21 U.S.C. section 360bbb-3(b)(1), unless the authorization is terminated or revoked sooner.  Performed at [facility], [address], [place], [place] [address]   MRSA PCR Screening     Status: None   Collection Time: 02/25/20 10:46 AM   Specimen: Nasal Mucosa; Nasopharyngeal  Result Value Ref Range Status   MRSA by PCR NEGATIVE NEGATIVE Final    Comment:        The GeneXpert MRSA Assay (FDA approved for NASAL specimens only), is one component of a comprehensive MRSA colonization surveillance program. It is not intended to diagnose MRSA infection nor to guide or monitor treatment for MRSA  infections. Performed at [facility], [address] [place], [place] [address]      Radiology Studies: No results found. Scheduled Meds: . Chlorhexidine Gluconate Cloth  6 each Topical Daily  . enoxaparin (LOVENOX) injection  60 mg Subcutaneous Q24H  . FLUoxetine  20 mg Oral Daily  . labetalol  400 mg Oral BID  . losartan  100 mg Oral Daily  . nicotine  21 mg Transdermal Daily  . rosuvastatin  20 mg Oral q1800  . spironolactone  25 mg Oral Daily   Continuous Infusions: . niCARDipine Stopped (02/27/20 0314)  . nitroGLYCERIN Stopped (02/26/20 1450)     LOS: 3 days    Time spent: 35 minutes    Vassie Loll, MD [facility]  If 7PM-7AM, please contact night-coverage [email or web address] 02/27/2020, 4:43 PM

## 2020-02-28 DIAGNOSIS — I5033 Acute on chronic diastolic (congestive) heart failure: Secondary | ICD-10-CM

## 2020-02-28 DIAGNOSIS — R778 Other specified abnormalities of plasma proteins: Secondary | ICD-10-CM

## 2020-02-28 LAB — BASIC METABOLIC PANEL
Anion gap: 9 (ref 5–15)
BUN: 19 mg/dL (ref 6–20)
CO2: 24 mmol/L (ref 22–32)
Calcium: 9 mg/dL (ref 8.9–10.3)
Chloride: 105 mmol/L (ref 98–111)
Creatinine, Ser: 0.84 mg/dL (ref 0.44–1.00)
GFR calc Af Amer: >60 mL/min (ref >60)
GFR calc non Af Amer: >60 mL/min (ref >60)
Glucose, Bld: 121 mg/dL — ABNORMAL HIGH (ref 70–99)
Potassium: 3.6 mmol/L (ref 3.5–5.1)
Sodium: 138 mmol/L (ref 135–145)

## 2020-02-28 LAB — CBC
HCT: 44.9 % (ref 36.0–46.0)
Hemoglobin: 13.7 g/dL (ref 12.0–15.0)
MCH: 26 pg (ref 26.0–34.0)
MCHC: 30.5 g/dL (ref 30.0–36.0)
MCV: 85.2 fL (ref 80.0–100.0)
Platelets: 329 10*3/uL (ref 150–400)
RBC: 5.27 MIL/uL — ABNORMAL HIGH (ref 3.87–5.11)
RDW: 15.9 % — ABNORMAL HIGH (ref 11.5–15.5)
WBC: 11.7 10*3/uL — ABNORMAL HIGH (ref 4.0–10.5)
nRBC: 0 % (ref 0.0–0.2)

## 2020-02-28 MED ORDER — ROSUVASTATIN CALCIUM 20 MG PO TABS: 20.0000 mg | ORAL_TABLET | Freq: Every day | ORAL | 2 refills | Status: DC

## 2020-02-28 MED ORDER — NICOTINE 21 MG/24HR TD PT24: 21.0000 mg | MEDICATED_PATCH | Freq: Every day | TRANSDERMAL | 2 refills | Status: DC

## 2020-02-28 MED ORDER — LOSARTAN POTASSIUM 100 MG PO TABS: 100.0000 mg | ORAL_TABLET | Freq: Every day | ORAL | 2 refills | Status: DC

## 2020-02-28 MED ORDER — SPIRONOLACTONE 50 MG PO TABS: 50.0000 mg | ORAL_TABLET | Freq: Every day | ORAL | 2 refills | Status: DC

## 2020-02-28 MED ORDER — LABETALOL HCL 200 MG PO TABS: 400.0000 mg | ORAL_TABLET | Freq: Two times a day (BID) | ORAL | 2 refills | Status: DC
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
## 2020-02-28 NOTE — Progress Notes (Signed)
Nsg Discharge Note  Admit Date:  02/24/2020 Discharge date: 02/28/2020   Chelsea Petty to be D/C'd Home per MD order.  AVS completed.  Copy for chart, and copy for patient signed, and dated. Removed IV-clean, dry, intact. Reviewed d/c paperwork with patient including CHF info. Asked Dr. Gwenlyn Perking to switch her pharmacy to [facility] on [address] Walked stable patient to main entrance where she walked to her car to drive herself home. Patient/caregiver able to verbalize understanding.  Discharge Medication: Allergies as of 02/28/2020      Reactions   Codeine Nausea And Vomiting   Hydrocodone Nausea Only      Medication List    STOP taking these medications   dextromethorphan-guaiFENesin 30-600 MG 12hr tablet Commonly known as: MUCINEX DM   furosemide 20 MG tablet Commonly known as: LASIX     TAKE these medications   FLUoxetine 20 MG capsule Commonly known as: PROzac Take 1 capsule (20 mg total) by mouth daily.   labetalol 200 MG tablet Commonly known as: NORMODYNE Take 2 tablets (400 mg total) by mouth 2 (two) times daily. What changed:   medication strength  how much to take  when to take this   losartan 100 MG tablet Commonly known as: COZAAR Take 1 tablet (100 mg total) by mouth daily.   nicotine 21 mg/24hr patch Commonly known as: NICODERM CQ - dosed in mg/24 hours Place 1 patch (21 mg total) onto the skin daily.   rosuvastatin 20 MG tablet Commonly known as: CRESTOR Take 1 tablet (20 mg total) by mouth daily at 6 PM.   spironolactone 50 MG tablet Commonly known as: ALDACTONE Take 1 tablet (50 mg total) by mouth daily.       Discharge Assessment: Vitals:   02/28/20 0800 02/28/20 0940  BP:    Pulse:  79  Resp:    Temp: 98.4 F (36.9 C)   SpO2:     Skin clean, dry and intact without evidence of skin break down, no evidence of skin tears noted. IV catheter discontinued intact. Site without signs and symptoms of complications - no redness or edema  noted at insertion site, patient denies c/o pain - only slight tenderness at site.  Dressing with slight pressure applied.  D/c Instructions-Education: Discharge instructions given to patient/family with verbalized understanding. D/c education completed with patient/family including follow up instructions, medication list, d/c activities limitations if indicated, with other d/c instructions as indicated by MD - patient able to verbalize understanding, all questions fully answered. Patient instructed to return to ED, call 911, or call MD for any changes in condition.  Patient escorted via WC, and D/C home via private auto.  Karolee Ohs, RN 02/28/2020 10:50 AM

## 2020-03-11 ENCOUNTER — Ambulatory Visit: Payer: BC Managed Care – PPO | Admitting: Family Medicine

## 2020-03-11 ENCOUNTER — Ambulatory Visit: Payer: Self-pay

## 2020-03-11 ENCOUNTER — Encounter: Payer: Self-pay | Admitting: Family Medicine

## 2020-03-11 ENCOUNTER — Other Ambulatory Visit: Payer: Self-pay

## 2020-03-11 VITALS — BP 128/78 | HR 69 | Ht 64.0 in | Wt 263.8 lb

## 2020-03-11 DIAGNOSIS — S060X0A Concussion without loss of consciousness, initial encounter: Secondary | ICD-10-CM

## 2020-03-11 DIAGNOSIS — M25512 Pain in left shoulder: Secondary | ICD-10-CM
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
## 2020-03-11 NOTE — Patient Instructions (Signed)
Thank you for coming in today. Try a heating pad.  Try TENS unit.  Let me know if you want a referral to PT in the [place] area.   TENS UNIT: This is helpful for muscle pain and spasm.   Search and Purchase a TENS 7000 2nd edition at  www.tenspros.com or www.Amazon.com It should be less than $30.     TENS unit instructions: Do not shower or bathe with the unit on Turn the unit off before removing electrodes or batteries If the electrodes lose stickiness add a drop of water to the electrodes after they are disconnected from the unit and place on plastic sheet. If you continued to have difficulty, call the TENS unit company to purchase more electrodes. Do not apply lotion on the skin area prior to use. Make sure the skin is clean and dry as this will help prolong the life of the electrodes. After use, always check skin for unusual red areas, rash or other skin difficulties. If there are any skin problems, does not apply electrodes to the same area. Never remove the electrodes from the unit by pulling the wires. Do not use the TENS unit or electrodes other than as directed. Do not change electrode placement without consultating your therapist or physician. Keep 2 fingers with between each electrode. Wear time ratio is 2:1, on to off times.    For example on for 30 minutes off for 15 minutes and then on for 30 minutes off for 15 minutes

## 2020-03-12 ENCOUNTER — Encounter: Payer: Self-pay | Admitting: Family Medicine

## 2020-03-12 ENCOUNTER — Telehealth: Payer: Self-pay | Admitting: Family Medicine

## 2020-03-12 NOTE — Telephone Encounter (Signed)
Letter ready for pickup.  Copy of letter sent electronically to my chart as well.

## 2020-03-12 NOTE — Telephone Encounter (Signed)
Called pt and LM on mobile that her work letter/note is ready for pick-up at the office and has also been sent electronically through [employer].

## 2020-03-12 NOTE — Telephone Encounter (Signed)
Patient called asking if a work note could be written stating she was out of work from July 15th through July 21st and is okay to return to work on July 22nd without restrictions. She would like to pick it up today if possible.

## 2020-03-13 NOTE — Telephone Encounter (Signed)
Letter has been picked up 

## 2020-03-24 NOTE — Progress Notes (Signed)
Cardiology Office Note    Date:  03/25/2020   ID:  Chelsea Petty, Chelsea Petty [birth year], MRN [id number]  PCP:  Benita Stabile, MD  Cardiologist: Nona Dell, MD    Chief Complaint  Patient presents with  . Hospitalization Follow-up    History of Present Illness:    Chelsea Petty is a 34 y.o. female with past medical history of CAD (s/p NSTEMI in 2014 with DES to D1 and DES to PL branch), HTN, HLD and obesity who presents to the office today for hospital follow-up.  She was most recently admitted to [facility] in 02/2020 for hypertensive urgency as SBP was greater than 200 upon arrival. She initially required IV Nitroglycerin and was transitioned to PO medications during admission. Repeat echocardiogram showed a preserved EF of 55 to 60% with moderate LVH and grade 3 DD. She did have mild mitral valve regurgitation but no significant valve abnormalities. She was eventually discharged on Labetalol 400 mg twice daily, Losartan 100 mg daily, Crestor 20 mg daily and Spironolactone 50 mg daily.  In talking with the patient today, she reports overall doing well from a cardiac perspective since her recent hospitalization. She denies any recent exertional chest pain or dyspnea on exertion. Says she has felt the best she has in months. No recent orthopnea or PND. She has noticed some lower extremity edema which is typically more noticeable after being at work and on her feet. Works at [employer].   She has been following her blood pressure at home and reports this has overall been well controlled.  She did have elevated readings last night after becoming anxious in the setting of applying Icy Hot to her shoulder and started to feel her heart race at that time. Symptoms resolved and BP improved after she took a shower and removed the ointment.   Past Medical History:  Diagnosis Date  . Coronary artery disease    a. s/p NSTEMI in 2014 with DES to D1 and DES to PL branch  . Essential hypertension     . Hyperlipidemia     Past Surgical History:  Procedure Laterality Date  . CORONARY STENT PLACEMENT      Current Medications: Outpatient Medications Prior to Visit  Medication Sig Dispense Refill  . cloNIDine (CATAPRES) 0.1 MG tablet Take 0.1 mg by mouth as needed (systolic 170> diastolic 90>).     Marland Kitchen FLUoxetine (PROZAC) 20 MG capsule Take 1 capsule (20 mg total) by mouth daily. 30 capsule 3  . methocarbamol (ROBAXIN) 750 MG tablet Take 750-1,500 mg by mouth every 6 (six) hours as needed for muscle spasms.     . nitroGLYCERIN (NITROSTAT) 0.4 MG SL tablet Place 0.4 mg under the tongue every 5 (five) minutes as needed for chest pain.     Marland Kitchen labetalol (NORMODYNE) 200 MG tablet Take 2 tablets (400 mg total) by mouth 2 (two) times daily. 120 tablet 2  . losartan (COZAAR) 100 MG tablet Take 1 tablet (100 mg total) by mouth daily. 30 tablet 2  . rosuvastatin (CRESTOR) 20 MG tablet Take 1 tablet (20 mg total) by mouth daily at 6 PM. 30 tablet 2  . spironolactone (ALDACTONE) 50 MG tablet Take 1 tablet (50 mg total) by mouth daily. 30 tablet 2  . nicotine (NICODERM CQ - DOSED IN MG/24 HOURS) 21 mg/24hr patch Place 1 patch (21 mg total) onto the skin daily. (Patient not taking: Reported on 03/11/2020) 28 patch 2   No facility-administered medications prior to  visit.     Allergies:   Codeine and Hydrocodone   Social History   Socioeconomic History  . Marital status: Single    Spouse name: Not on file  . Number of children: Not on file  . Years of education: Not on file  . Highest education level: Not on file  Occupational History  . Not on file  Tobacco Use  . Smoking status: Current Every Day Smoker    Packs/day: 0.50    Years: 15.00    Pack years: 7.50    Types: Cigarettes  . Smokeless tobacco: Never Used  Vaping Use  . Vaping Use: Never used  Substance and Sexual Activity  . Alcohol use: No  . Drug use: No  . Sexual activity: Not on file  Other Topics Concern  . Not on file   Social History Narrative  . Not on file   Social Determinants of Health   Financial Resource Strain:   . Difficulty of Paying Living Expenses:   Food Insecurity:   . Worried About Programme researcher, broadcasting/film/video in the Last Year:   . Barista in the Last Year:   Transportation Needs:   . Freight forwarder (Medical):   Marland Kitchen Lack of Transportation (Non-Medical):   Physical Activity:   . Days of Exercise per Week:   . Minutes of Exercise per Session:   Stress:   . Feeling of Stress :   Social Connections:   . Frequency of Communication with Friends and Family:   . Frequency of Social Gatherings with Friends and Family:   . Attends Religious Services:   . Active Member of Clubs or Organizations:   . Attends Banker Meetings:   Marland Kitchen Marital Status:      Family History:  The patient's family history includes Cancer in her mother; Diabetes in her brother, father, and paternal grandfather; Heart disease in her father; Hypertension in her father and mother; Other in her maternal grandfather, maternal grandmother, and paternal grandmother.   Review of Systems:   Please see the history of present illness.     General:  No chills, fever, night sweats or weight changes.   Cardiovascular:  No chest pain, dyspnea on exertion, orthopnea, palpitations, paroxysmal nocturnal dyspnea. Positive for edema.  Dermatological: No rash, lesions/masses Respiratory: No cough, dyspnea Urologic: No hematuria, dysuria Abdominal:   No nausea, vomiting, diarrhea, bright red blood per rectum, melena, or hematemesis Neurologic:  No visual changes, wkns, changes in mental status. All other systems reviewed and are otherwise negative except as noted above.   Physical Exam:    VS:  BP 140/90 (BP Location: Left Arm, Patient Position: Sitting, Cuff Size: Large)   Pulse 82   Ht 5\' 4"  (1.626 m)   Wt 266 lb 9.6 oz (120.9 kg)   SpO2 98% Comment: at rest  BMI 45.76 kg/m    General: Well developed, well  nourished,female appearing in no acute distress. Head: Normocephalic, atraumatic, sclera non-icteric.  Neck: No carotid bruits. JVD not elevated.  Lungs: Respirations regular and unlabored, without wheezes or rales.  Heart: Regular rate and rhythm. No S3 or S4.  No murmur, no rubs, or gallops appreciated. Abdomen: Soft, non-tender, non-distended. No obvious abdominal masses. Msk:  Strength and tone appear normal for age. No obvious joint deformities or effusions. Extremities: No clubbing or cyanosis. Trace ankle edema bilaterally.  Distal pedal pulses are 2+ bilaterally. Neuro: Alert and oriented X 3. Moves all extremities spontaneously. No focal  deficits noted. Psych:  Responds to questions appropriately with a normal affect. Skin: No rashes or lesions noted  Wt Readings from Last 3 Encounters:  03/25/20 266 lb 9.6 oz (120.9 kg)  03/11/20 263 lb 12.8 oz (119.7 kg)  02/26/20 275 lb 2.2 oz (124.8 kg)     Studies/Labs Reviewed:   EKG:  EKG is not ordered today.   Recent Labs: 02/13/2020: ALT 11; TSH 2.16 02/24/2020: B Natriuretic Peptide 334.0 02/28/2020: BUN 19; Creatinine, Ser 0.84; Hemoglobin 13.7; Platelets 329; Potassium 3.6; Sodium 138   Lipid Panel    Component Value Date/Time   LDLDIRECT 156.0 02/13/2020 1519    Additional studies/ records that were reviewed today include:   Echocardiogram: 02/2020 IMPRESSIONS    1. Left ventricular ejection fraction, by estimation, is 55 to 60%. The  left ventricle has normal function. The left ventricle has no regional  wall motion abnormalities. There is moderate left ventricular hypertrophy.  Left ventricular diastolic  parameters are consistent with Grade III diastolic dysfunction  (restrictive).  2. Right ventricular systolic function is normal. The right ventricular  size is normal. Tricuspid regurgitation signal is inadequate for assessing  PA pressure.  3. Left atrial size was moderately dilated.  4. The mitral valve is  grossly normal. Mild mitral valve regurgitation.  5. The aortic valve was not well visualized, mild annular calcification.  Aortic valve regurgitation is not visualized.  6. The inferior vena cava is dilated in size with >50% respiratory  variability, suggesting right atrial pressure of 8 mmHg.   Assessment:    1. Coronary artery disease involving native coronary artery of native heart without angina pectoris   2. Essential hypertension   3. Hyperlipidemia LDL goal <70      Plan:   In order of problems listed above:  1. CAD - She is s/p NSTEMI in 2014 with DES to D1 and DES to PL branch.  Recent echocardiogram showed preserved EF with no regional motion abnormalities as outlined above. - She denies any recent anginal symptoms. Continue with medical therapy at this time. - Continue Crestor 20 mg daily and Labetalol 400 mg twice daily. She is not currently on ASA and I did recommend she restart ASA 81 mg daily.  2. HTN - BP is at 140/90 during today's visit which is significantly improved as compared to her hospitalization. She does have a blood pressure cuff at home and I encouraged her to continue to follow this and report back if SBP remains elevated in the 140's. Will continue her current medication regimen at this time with Labetalol 400 mg twice daily, Losartan 100 mg daily and Spironolactone 50 mg daily. Will request a copy of recent labs from her PCP. Labetalol can be further titrated if BP remains above goal.  3. HLD - She reports having recent labs with her PCP and I will request a copy of these. She remains on Crestor 20 mg daily with goal LDL less than 70 in the setting of known CAD.   Medication Adjustments/Labs and Tests Ordered: Current medicines are reviewed at length with the patient today.  Concerns regarding medicines are outlined above.  Medication changes, Labs and Tests ordered today are listed in the Patient Instructions below. Patient Instructions  Medication  Instructions:  Your physician recommends that you continue on your current medications as directed. Please refer to the Current Medication list given to you today.  Restart Aspirin 81 mg Daily   *If you need a refill on your cardiac  medications before your next appointment, please call your pharmacy*   Lab Work: NONE  If you have labs (blood work) drawn today and your tests are completely normal, you will receive your results only by: Marland Kitchen MyChart Message (if you have MyChart) OR . A paper copy in the mail If you have any lab test that is abnormal or we need to change your treatment, we will call you to review the results.   Testing/Procedures: NONE   Follow-Up: At [facility], you and your health needs are our priority.  As part of our continuing mission to provide you with exceptional heart care, we have created designated Provider Care Teams.  These Care Teams include your primary Cardiologist (physician) and Advanced Practice Providers (APPs -  Physician Assistants and Nurse Practitioners) who all work together to provide you with the care you need, when you need it.  We recommend signing up for the patient portal called "MyChart".  Sign up information is provided on this After Visit Summary.  MyChart is used to connect with patients for Virtual Visits (Telemedicine).  Patients are able to view lab/test results, encounter notes, upcoming appointments, etc.  Non-urgent messages can be sent to your provider as well.   To learn more about what you can do with MyChart, go to [email or web address].    Your next appointment:   3-4 month(s)  The format for your next appointment:   In Person  Provider:   Nona Dell, MD or Randall An, PA-C   Other Instructions Thank you for choosing [facility]!    Signed, Ellsworth Lennox, PA-C  03/25/2020 5:17 PM    [facility] [address] [place], [place] [address] Phone: [phone] Fax:  [phone]

## 2020-03-25 ENCOUNTER — Other Ambulatory Visit: Payer: Self-pay

## 2020-03-25 ENCOUNTER — Encounter: Payer: Self-pay | Admitting: Student

## 2020-03-25 ENCOUNTER — Ambulatory Visit (INDEPENDENT_AMBULATORY_CARE_PROVIDER_SITE_OTHER): Payer: BC Managed Care – PPO | Admitting: Student

## 2020-03-25 VITALS — BP 140/90 | HR 82 | Ht 64.0 in | Wt 266.6 lb

## 2020-03-25 DIAGNOSIS — E785 Hyperlipidemia, unspecified: Secondary | ICD-10-CM

## 2020-03-25 DIAGNOSIS — I2510 Atherosclerotic heart disease of native coronary artery without angina pectoris: Secondary | ICD-10-CM

## 2020-03-25 DIAGNOSIS — I10 Essential (primary) hypertension: Secondary | ICD-10-CM | POA: Diagnosis not present

## 2020-03-25 MED ORDER — LOSARTAN POTASSIUM 100 MG PO TABS: 100.0000 mg | ORAL_TABLET | Freq: Every day | ORAL | 11 refills | Status: DC

## 2020-03-25 MED ORDER — SPIRONOLACTONE 50 MG PO TABS: 50.0000 mg | ORAL_TABLET | Freq: Every day | ORAL | 11 refills | Status: DC

## 2020-03-25 MED ORDER — ROSUVASTATIN CALCIUM 20 MG PO TABS: 20.0000 mg | ORAL_TABLET | Freq: Every day | ORAL | 11 refills | Status: DC

## 2020-03-25 MED ORDER — LABETALOL HCL 200 MG PO TABS: 400.0000 mg | ORAL_TABLET | Freq: Two times a day (BID) | ORAL | 11 refills | Status: DC

## 2020-03-25 MED ORDER — ASPIRIN EC 81 MG PO TBEC
81.0000 mg | DELAYED_RELEASE_TABLET | Freq: Every day | ORAL | 3 refills | Status: AC
Start: 2020-03-25 — End: ?

## 2020-03-25 NOTE — Patient Instructions (Signed)
Medication Instructions:  Your physician recommends that you continue on your current medications as directed. Please refer to the Current Medication list given to you today.  Restart Aspirin 81 mg Daily   *If you need a refill on your cardiac medications before your next appointment, please call your pharmacy*   Lab Work: NONE  If you have labs (blood work) drawn today and your tests are completely normal, you will receive your results only by:  MyChart Message (if you have MyChart) OR  A paper copy in the mail If you have any lab test that is abnormal or we need to change your treatment, we will call you to review the results.   Testing/Procedures: NONE   Follow-Up: At [facility], you and your health needs are our priority.  As part of our continuing mission to provide you with exceptional heart care, we have created designated Provider Care Teams.  These Care Teams include your primary Cardiologist (physician) and Advanced Practice Providers (APPs -  Physician Assistants and Nurse Practitioners) who all work together to provide you with the care you need, when you need it.  We recommend signing up for the patient portal called "MyChart".  Sign up information is provided on this After Visit Summary.  MyChart is used to connect with patients for Virtual Visits (Telemedicine).  Patients are able to view lab/test results, encounter notes, upcoming appointments, etc.  Non-urgent messages can be sent to your provider as well.   To learn more about what you can do with MyChart, go to [email or web address].    Your next appointment:   3-4 month(s)  The format for your next appointment:   In Person  Provider:   Nona Dell, MD or Randall An, PA-C   Other Instructions Thank you for choosing [facility]!

## 2020-04-02 ENCOUNTER — Ambulatory Visit: Payer: BC Managed Care – PPO | Admitting: Family Medicine

## 2020-04-02 ENCOUNTER — Encounter: Payer: Self-pay | Admitting: Family Medicine

## 2020-04-02 ENCOUNTER — Ambulatory Visit: Payer: Self-pay

## 2020-04-02 ENCOUNTER — Ambulatory Visit (INDEPENDENT_AMBULATORY_CARE_PROVIDER_SITE_OTHER): Payer: BC Managed Care – PPO

## 2020-04-02 ENCOUNTER — Other Ambulatory Visit: Payer: Self-pay

## 2020-04-02 VITALS — BP 140/98 | HR 65 | Ht 64.0 in | Wt 267.4 lb

## 2020-04-02 DIAGNOSIS — M25512 Pain in left shoulder: Secondary | ICD-10-CM

## 2020-04-02 DIAGNOSIS — M542 Cervicalgia: Secondary | ICD-10-CM

## 2020-04-02 DIAGNOSIS — R079 Chest pain, unspecified: Secondary | ICD-10-CM

## 2020-04-02 NOTE — Patient Instructions (Signed)
Thank you for coming in today. I will complete paperwork by 2pm tomorrow.  Plan for PT.  I will also communicated with your cardiologist. I think you need a little more.  Recheck with me in 1 month.
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
## 2020-04-03 ENCOUNTER — Telehealth: Payer: Self-pay

## 2020-04-03 NOTE — Progress Notes (Signed)
X-ray cervical spine shows some mild arthritis at C5-C6 and evidence of neck muscle spasm.

## 2020-04-03 NOTE — Telephone Encounter (Signed)
error 

## 2020-04-13 ENCOUNTER — Ambulatory Visit: Payer: BC Managed Care – PPO | Admitting: Physician Assistant

## 2020-04-22 ENCOUNTER — Ambulatory Visit (HOSPITAL_COMMUNITY): Payer: BC Managed Care – PPO | Attending: Family Medicine | Admitting: Physical Therapy

## 2020-05-06 ENCOUNTER — Ambulatory Visit: Payer: BC Managed Care – PPO | Admitting: Family Medicine

## 2020-05-06 NOTE — Progress Notes (Deleted)
    PATRISIA Petty is a 34 y.o. female who presents to [facility] at [facility] today for f/u of L shoulder pain.  She was last seen by Dr. 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  She was referred to PT but has not completed any visits.  Since her last visit, pt reports   Diagnostic imaging: C-spine XR- 04/02/20  Pertinent review of systems: ***  Relevant historical information: ***   Exam:  There were no vitals taken for this visit. General: Well Developed, well nourished, and in no acute distress.   MSK: ***    Lab and Radiology Results No results found for this or any previous visit (from the past 72 hour(s)). No results found.     Assessment and Plan: 34 y.o. female with ***   PDMP not reviewed this encounter. No orders of the defined types were placed in this encounter.  No orders of the defined types were placed in this encounter.    Discussed warning signs or symptoms. Please see discharge instructions. Patient expresses understanding.   ***

## 2020-05-15 ENCOUNTER — Ambulatory Visit: Payer: BC Managed Care – PPO | Admitting: Student

## 2020-06-30 NOTE — Progress Notes (Signed)
Cardiology Office Note    Date:  07/01/2020   ID:  Jacqulynn, Shappell 24-Jan-1986, MRN [id number]  PCP:  Benita Stabile, MD  Cardiologist: Nona Dell, MD    Chief Complaint  Patient presents with  . Follow-up    3 month visit    History of Present Illness:    ZALEA PETE is a 34 y.o. female with past medical history of CAD (s/p NSTEMI in 2014 with DES to D1 and DES to PL branch), HTN, HLD and obesity who presents to the office today for 104-month follow-up.  She was last examined by myself in 03/2020 and denied any recent exertional chest pain or dyspnea on exertion. BP was much improved as compared to prior values and she was continued on Labetalol 400mg  BID, Losartan 100mg  daily, Spironolactone 50mg  daily and Crestor 20mg  daily with ASA 81 mg daily added back to her medication regimen given her history of CAD.   In talking with the patient today, she denies any recent chest pain or palpitations. She does report having orthopnea at night over the past few weeks and has been having to sleep with more pillows. Breathing has overall been stable during the day. Weight has increased by 5-10 lbs on her home scales.  She reports good compliance with her current medication regimen but says her SBP has been elevated in the 150's to 170's when checked at home. She is monitoring her sodium intake and fluid status.  Past Medical History:  Diagnosis Date  . Coronary artery disease    a. s/p NSTEMI in 2014 with DES to D1 and DES to PL branch  . Essential hypertension   . Hyperlipidemia     Past Surgical History:  Procedure Laterality Date  . CORONARY STENT PLACEMENT      Current Medications: Outpatient Medications Prior to Visit  Medication Sig Dispense Refill  . aspirin EC 81 MG tablet Take 1 tablet (81 mg total) by mouth daily. Swallow whole. 90 tablet 3  . cloNIDine (CATAPRES) 0.1 MG tablet Take 0.1 mg by mouth as needed (systolic 170> diastolic 90>).     FLUoxetine  (PROZAC) 20 MG capsule Take 1 capsule (20 mg total) by mouth daily. 30 capsule 3  . labetalol (NORMODYNE) 200 MG tablet Take 2 tablets (400 mg total) by mouth 2 (two) times daily. 120 tablet 11  . losartan (COZAAR) 100 MG tablet Take 1 tablet (100 mg total) by mouth daily. 30 tablet 11  . methocarbamol (ROBAXIN) 750 MG tablet Take 750-1,500 mg by mouth every 6 (six) hours as needed for muscle spasms.     . nitroGLYCERIN (NITROSTAT) 0.4 MG SL tablet Place 0.4 mg under the tongue every 5 (five) minutes as needed for chest pain.     . rosuvastatin (CRESTOR) 20 MG tablet Take 1 tablet (20 mg total) by mouth daily at 6 PM. 30 tablet 11  . spironolactone (ALDACTONE) 50 MG tablet Take 1 tablet (50 mg total) by mouth daily. 30 tablet 11   No facility-administered medications prior to visit.     Allergies:   Codeine and Hydrocodone   Social History   Socioeconomic History  . Marital status: Single    Spouse name: Not on file  . Number of children: Not on file  . Years of education: Not on file  . Highest education level: Not on file  Occupational History  . Not on file  Tobacco Use  . Smoking status: Current Every Day Smoker  Packs/day: 0.75    Years: 15.00    Pack years: 11.25    Types: Cigarettes  . Smokeless tobacco: Never Used  Vaping Use  . Vaping Use: Never used  Substance and Sexual Activity  . Alcohol use: No  . Drug use: No  . Sexual activity: Not on file  Other Topics Concern  . Not on file  Social History Narrative  . Not on file   Social Determinants of Health   Financial Resource Strain:   . Difficulty of Paying Living Expenses: Not on file  Food Insecurity:   . Worried About Programme researcher, broadcasting/film/video in the Last Year: Not on file  . Ran Out of Food in the Last Year: Not on file  Transportation Needs:   . Lack of Transportation (Medical): Not on file  . Lack of Transportation (Non-Medical): Not on file  Physical Activity:   . Days of Exercise per Week: Not on file    . Minutes of Exercise per Session: Not on file  Stress:   . Feeling of Stress : Not on file  Social Connections:   . Frequency of Communication with Friends and Family: Not on file  . Frequency of Social Gatherings with Friends and Family: Not on file  . Attends Religious Services: Not on file  . Active Member of Clubs or Organizations: Not on file  . Attends Banker Meetings: Not on file  . Marital Status: Not on file     Family History:  The patient's family history includes Cancer in her mother; Diabetes in her brother, father, and paternal grandfather; Heart disease in her father; Hypertension in her father and mother; Other in her maternal grandfather, maternal grandmother, and paternal grandmother.   Review of Systems:   Please see the history of present illness.     General:  No chills, fever, night sweats or weight changes.  Cardiovascular:  No chest pain, dyspnea on exertion, edema, palpitations, paroxysmal nocturnal dyspnea. Positive for orthopnea.  Dermatological: No rash, lesions/masses Respiratory: No cough, dyspnea Urologic: No hematuria, dysuria Abdominal:   No nausea, vomiting, diarrhea, bright red blood per rectum, melena, or hematemesis Neurologic:  No visual changes, wkns, changes in mental status. All other systems reviewed and are otherwise negative except as noted above.   Physical Exam:    VS:  BP (!) 172/100   Pulse 66   Ht 5\' 4"  (1.626 m)   Wt 279 lb 6.4 oz (126.7 kg)   SpO2 97%   BMI 47.96 kg/m    General: Well developed, well nourished,female appearing in no acute distress. Head: Normocephalic, atraumatic. Neck: No carotid bruits. JVD not elevated.  Lungs: Respirations regular and unlabored, without wheezes or rales.  Heart: Regular rate and rhythm. No S3 or S4.  No murmur, no rubs, or gallops appreciated. Abdomen: Appears non-distended. No obvious abdominal masses. Msk:  Strength and tone appear normal for age. No obvious joint  deformities or effusions. Extremities: No clubbing or cyanosis. No lower extremity edema.  Distal pedal pulses are 2+ bilaterally. Neuro: Alert and oriented X 3. Moves all extremities spontaneously. No focal deficits noted. Psych:  Responds to questions appropriately with a normal affect. Skin: No rashes or lesions noted  Wt Readings from Last 3 Encounters:  07/01/20 279 lb 6.4 oz (126.7 kg)  04/02/20 267 lb 6.4 oz (121.3 kg)  03/25/20 266 lb 9.6 oz (120.9 kg)      Studies/Labs Reviewed:   EKG:  EKG is not ordered today.  Recent Labs: 02/13/2020: ALT 11; TSH 2.16 02/24/2020: B Natriuretic Peptide 334.0 02/28/2020: BUN 19; Creatinine, Ser 0.84; Hemoglobin 13.7; Platelets 329; Potassium 3.6; Sodium 138   Lipid Panel    Component Value Date/Time   LDLDIRECT 156.0 02/13/2020 1519    Additional studies/ records that were reviewed today include:   Echocardiogram: 02/2020 IMPRESSIONS    1. Left ventricular ejection fraction, by estimation, is 55 to 60%. The  left ventricle has normal function. The left ventricle has no regional  wall motion abnormalities. There is moderate left ventricular hypertrophy.  Left ventricular diastolic  parameters are consistent with Grade III diastolic dysfunction  (restrictive).  2. Right ventricular systolic function is normal. The right ventricular  size is normal. Tricuspid regurgitation signal is inadequate for assessing  PA pressure.  3. Left atrial size was moderately dilated.  4. The mitral valve is grossly normal. Mild mitral valve regurgitation.  5. The aortic valve was not well visualized, mild annular calcification.  Aortic valve regurgitation is not visualized.  6. The inferior vena cava is dilated in size with >50% respiratory  variability, suggesting right atrial pressure of 8 mmHg.   Assessment:    1. Coronary artery disease involving native coronary artery of native heart without angina pectoris   2. Essential hypertension    3. Medication management   4. Hyperlipidemia LDL goal <70      Plan:   In order of problems listed above:  1. CAD - She is s/p NSTEMI in 2014 with DES to D1 and DES to PL branch. She denies any recent anginal symptoms.  - Continue current medication regimen with ASA 81mg  daily, Labetalol 400mg  BID and Crestor 20mg  daily.  2. HTN - BP is elevated at 172/100 during today's visit and has been elevated when checked at home as well.  She reports good compliance with her medication regimen of Labetalol 400mg  BID, Losartan 100mg  daily and Spironolactone 50mg  daily. Will add Chlorthalidone 25 mg daily (start at 12.5mg  daily for a few days then go to 25mg  daily) to her medication regimen to assist with blood pressure control and also reports of dyspnea at night concerning for orthopnea. Lungs are clear on examination today. BP previously did not respond to HCTZ. Recheck BMET in 2 weeks and she was encouraged to follow BP at home and report back with readings.  3. HLD - She remains on Crestor 20 mg daily with goal LDL less than 70 in the setting of known CAD. Will request a copy of most recent labs from her PCP. LDL was elevated at 156 in 01/2020.   Medication Adjustments/Labs and Tests Ordered: Current medicines are reviewed at length with the patient today.  Concerns regarding medicines are outlined above.  Medication changes, Labs and Tests ordered today are listed in the Patient Instructions below. Patient Instructions  Medication Instructions:  Your physician has recommended you make the following change in your medication:   Start Chlorthalidone 25 mg Daily ( May try 12.5mg  daily for the first few days)   *If you need a refill on your cardiac medications before your next appointment, please call your pharmacy*   Lab Work: Your physician recommends that you return for lab work in: 2 Weeks ( 07/16/20)  If you have labs (blood work) drawn today and your tests are completely normal, you  will receive your results only by: Marland Kitchen. MyChart Message (if you have MyChart) OR . A paper copy in the mail If you have any lab test that  is abnormal or we need to change your treatment, we will call you to review the results.   Testing/Procedures: NONE     Follow-Up: At [facility], you and your health needs are our priority.  As part of our continuing mission to provide you with exceptional heart care, we have created designated Provider Care Teams.  These Care Teams include your primary Cardiologist (physician) and Advanced Practice Providers (APPs -  Physician Assistants and Nurse Practitioners) who all work together to provide you with the care you need, when you need it.  We recommend signing up for the patient portal called "MyChart".  Sign up information is provided on this After Visit Summary.  MyChart is used to connect with patients for Virtual Visits (Telemedicine).  Patients are able to view lab/test results, encounter notes, upcoming appointments, etc.  Non-urgent messages can be sent to your provider as well.   To learn more about what you can do with MyChart, go to [email or web address].    Your next appointment:   4-6 week(s)  The format for your next appointment:   In Person  Provider:   Randall An, PA-C   Other Instructions Thank you for choosing [facility]!     Signed, Ellsworth Lennox, PA-C  07/01/2020 5:08 PM    [facility] [address] [place], [place] [address] Phone: [phone] Fax: [phone]

## 2020-07-01 ENCOUNTER — Ambulatory Visit: Payer: BC Managed Care – PPO | Admitting: Student

## 2020-07-01 ENCOUNTER — Other Ambulatory Visit: Payer: Self-pay

## 2020-07-01 ENCOUNTER — Encounter: Payer: Self-pay | Admitting: Student

## 2020-07-01 VITALS — BP 172/100 | HR 66 | Ht 64.0 in | Wt 279.4 lb

## 2020-07-01 DIAGNOSIS — Z79899 Other long term (current) drug therapy: Secondary | ICD-10-CM

## 2020-07-01 DIAGNOSIS — I10 Essential (primary) hypertension: Secondary | ICD-10-CM | POA: Diagnosis not present

## 2020-07-01 DIAGNOSIS — I2510 Atherosclerotic heart disease of native coronary artery without angina pectoris: Secondary | ICD-10-CM | POA: Diagnosis not present

## 2020-07-01 DIAGNOSIS — E785 Hyperlipidemia, unspecified: Secondary | ICD-10-CM | POA: Diagnosis not present

## 2020-07-01 MED ORDER — CHLORTHALIDONE 25 MG PO TABS
25.0000 mg | ORAL_TABLET | Freq: Every day | ORAL | 5 refills | Status: DC
Start: 2020-07-01 — End: 2021-01-11

## 2020-07-01 NOTE — Patient Instructions (Signed)
Medication Instructions:  Your physician has recommended you make the following change in your medication:   Start Chlorthalidone 25 mg Daily ( May try 12.5mg  daily for the first few days)   *If you need a refill on your cardiac medications before your next appointment, please call your pharmacy*   Lab Work: Your physician recommends that you return for lab work in: 2 Weeks ( 07/16/20)  If you have labs (blood work) drawn today and your tests are completely normal, you will receive your results only by: Marland Kitchen MyChart Message (if you have MyChart) OR . A paper copy in the mail If you have any lab test that is abnormal or we need to change your treatment, we will call you to review the results.   Testing/Procedures: NONE     Follow-Up: At [facility], you and your health needs are our priority.  As part of our continuing mission to provide you with exceptional heart care, we have created designated Provider Care Teams.  These Care Teams include your primary Cardiologist (physician) and Advanced Practice Providers (APPs -  Physician Assistants and Nurse Practitioners) who all work together to provide you with the care you need, when you need it.  We recommend signing up for the patient portal called "MyChart".  Sign up information is provided on this After Visit Summary.  MyChart is used to connect with patients for Virtual Visits (Telemedicine).  Patients are able to view lab/test results, encounter notes, upcoming appointments, etc.  Non-urgent messages can be sent to your provider as well.   To learn more about what you can do with MyChart, go to [email or web address].    Your next appointment:   4-6 week(s)  The format for your next appointment:   In Person  Provider:   Randall An, PA-C   Other Instructions Thank you for choosing [facility]!

## 2020-08-12 ENCOUNTER — Ambulatory Visit: Payer: BC Managed Care – PPO | Admitting: Student

## 2020-08-12 NOTE — Progress Notes (Deleted)
Cardiology Office Note    Date:  08/12/2020   ID:  Tracye, Szuch 17-Apr-1986, MRN [id number]  PCP:  Benita Stabile, MD  Cardiologist: Nona Dell, MD    No chief complaint on file.   History of Present Illness:    Chelsea Petty is a 34 y.o. female with past medical history ofCAD (s/p NSTEMI in 2014 with DES to D1 and DES to PL branch), HTN, HLD and obesitywho presents to the office today for 6-week follow-up.   She was last examined myself in 06/2020 and denied any recent chest pain at that time. She did report worsening orthopnea and her weight had increased by 5 to 10 pounds on her home scales and SBP has been elevated in the 150's to 170's.  She was continued on Labetalol 400mg  BID, Losartan 100mg  daily and Spironolactone 50mg  daily with Chlorthalidone 25mg  daily added to her medication regimen.   - BMET; FLP  Past Medical History:  Diagnosis Date  . Coronary artery disease    a. s/p NSTEMI in 2014 with DES to D1 and DES to PL branch  . Essential hypertension   . Hyperlipidemia     Past Surgical History:  Procedure Laterality Date  . CORONARY STENT PLACEMENT      Current Medications: Outpatient Medications Prior to Visit  Medication Sig Dispense Refill  . aspirin EC 81 MG tablet Take 1 tablet (81 mg total) by mouth daily. Swallow whole. 90 tablet 3  . chlorthalidone (HYGROTON) 25 MG tablet Take 1 tablet (25 mg total) by mouth daily. 30 tablet 5  . cloNIDine (CATAPRES) 0.1 MG tablet Take 0.1 mg by mouth as needed (systolic 170> diastolic 90>).     FLUoxetine (PROZAC) 20 MG capsule Take 1 capsule (20 mg total) by mouth daily. 30 capsule 3  . labetalol (NORMODYNE) 200 MG tablet Take 2 tablets (400 mg total) by mouth 2 (two) times daily. 120 tablet 11  . losartan (COZAAR) 100 MG tablet Take 1 tablet (100 mg total) by mouth daily. 30 tablet 11  . methocarbamol (ROBAXIN) 750 MG tablet Take 750-1,500 mg by mouth every 6 (six) hours as needed for muscle  spasms.     . nitroGLYCERIN (NITROSTAT) 0.4 MG SL tablet Place 0.4 mg under the tongue every 5 (five) minutes as needed for chest pain.     . rosuvastatin (CRESTOR) 20 MG tablet Take 1 tablet (20 mg total) by mouth daily at 6 PM. 30 tablet 11  . spironolactone (ALDACTONE) 50 MG tablet Take 1 tablet (50 mg total) by mouth daily. 30 tablet 11   No facility-administered medications prior to visit.     Allergies:   Codeine and Hydrocodone   Social History   Socioeconomic History  . Marital status: Single    Spouse name: Not on file  . Number of children: Not on file  . Years of education: Not on file  . Highest education level: Not on file  Occupational History  . Not on file  Tobacco Use  . Smoking status: Current Every Day Smoker    Packs/day: 0.75    Years: 15.00    Pack years: 11.25    Types: Cigarettes  . Smokeless tobacco: Never Used  Vaping Use  . Vaping Use: Never used  Substance and Sexual Activity  . Alcohol use: No  . Drug use: No  . Sexual activity: Not on file  Other Topics Concern  . Not on file  Social History Narrative  .  Not on file   Social Determinants of Health   Financial Resource Strain: Not on file  Food Insecurity: Not on file  Transportation Needs: Not on file  Physical Activity: Not on file  Stress: Not on file  Social Connections: Not on file     Family History:  The patient's ***family history includes Cancer in her mother; Diabetes in her brother, father, and paternal grandfather; Heart disease in her father; Hypertension in her father and mother; Other in her maternal grandfather, maternal grandmother, and paternal grandmother.   Review of Systems:   Please see the history of present illness.     General:  No chills, fever, night sweats or weight changes.  Cardiovascular:  No chest pain, dyspnea on exertion, edema, orthopnea, palpitations, paroxysmal nocturnal dyspnea. Dermatological: No rash, lesions/masses Respiratory: No cough,  dyspnea Urologic: No hematuria, dysuria Abdominal:   No nausea, vomiting, diarrhea, bright red blood per rectum, melena, or hematemesis Neurologic:  No visual changes, wkns, changes in mental status. All other systems reviewed and are otherwise negative except as noted above.   Physical Exam:    VS:  There were no vitals taken for this visit.   General: Well developed, well nourished,female appearing in no acute distress. Head: Normocephalic, atraumatic. Neck: No carotid bruits. JVD not elevated.  Lungs: Respirations regular and unlabored, without wheezes or rales.  Heart: ***Regular rate and rhythm. No S3 or S4.  No murmur, no rubs, or gallops appreciated. Abdomen: Appears non-distended. No obvious abdominal masses. Msk:  Strength and tone appear normal for age. No obvious joint deformities or effusions. Extremities: No clubbing or cyanosis. No edema.  Distal pedal pulses are 2+ bilaterally. Neuro: Alert and oriented X 3. Moves all extremities spontaneously. No focal deficits noted. Psych:  Responds to questions appropriately with a normal affect. Skin: No rashes or lesions noted  Wt Readings from Last 3 Encounters:  07/01/20 279 lb 6.4 oz (126.7 kg)  04/02/20 267 lb 6.4 oz (121.3 kg)  03/25/20 266 lb 9.6 oz (120.9 kg)        Studies/Labs Reviewed:   EKG:  EKG is*** ordered today.  The ekg ordered today demonstrates ***  Recent Labs: 02/13/2020: ALT 11; TSH 2.16 02/24/2020: B Natriuretic Peptide 334.0 02/28/2020: BUN 19; Creatinine, Ser 0.84; Hemoglobin 13.7; Platelets 329; Potassium 3.6; Sodium 138   Lipid Panel    Component Value Date/Time   LDLDIRECT 156.0 02/13/2020 1519    Additional studies/ records that were reviewed today include:   Echocardiogram: 02/2020 IMPRESSIONS    1. Left ventricular ejection fraction, by estimation, is 55 to 60%. The  left ventricle has normal function. The left ventricle has no regional  wall motion abnormalities. There is moderate  left ventricular hypertrophy.  Left ventricular diastolic  parameters are consistent with Grade III diastolic dysfunction  (restrictive).  2. Right ventricular systolic function is normal. The right ventricular  size is normal. Tricuspid regurgitation signal is inadequate for assessing  PA pressure.  3. Left atrial size was moderately dilated.  4. The mitral valve is grossly normal. Mild mitral valve regurgitation.  5. The aortic valve was not well visualized, mild annular calcification.  Aortic valve regurgitation is not visualized.  6. The inferior vena cava is dilated in size with >50% respiratory  variability, suggesting right atrial pressure of 8 mmHg.   Assessment:    No diagnosis found.   Plan:   In order of problems listed above:  1. CAD - She is s/p NSTEMI in 2014  with DES to D1 and DES to PL branch.  2. HTN - ***  3. HLD - ***  Shared Decision Making/Informed Consent:   {Are you ordering a CV Procedure (e.g. stress test, cath, DCCV, TEE, etc)?   Press F2        :[id number]}    Medication Adjustments/Labs and Tests Ordered: Current medicines are reviewed at length with the patient today.  Concerns regarding medicines are outlined above.  Medication changes, Labs and Tests ordered today are listed in the Patient Instructions below. There are no Patient Instructions on file for this visit.   Signed, Ellsworth Lennox, PA-C  08/12/2020 7:45 AM    [facility] [address] [place], [place] [address] Phone: [phone] Fax: [phone]

## 2020-08-26 ENCOUNTER — Other Ambulatory Visit: Payer: Self-pay | Admitting: Adult Health

## 2021-01-10 ENCOUNTER — Other Ambulatory Visit: Payer: Self-pay | Admitting: Student

## 2021-01-11 NOTE — Telephone Encounter (Signed)
This is a [place] pt.  °

## 2021-04-03 ENCOUNTER — Other Ambulatory Visit: Payer: Self-pay | Admitting: Student

## 2021-06-30 DIAGNOSIS — E782 Mixed hyperlipidemia: Secondary | ICD-10-CM | POA: Diagnosis not present

## 2021-08-05 DIAGNOSIS — J069 Acute upper respiratory infection, unspecified: Secondary | ICD-10-CM | POA: Diagnosis not present

## 2021-08-18 ENCOUNTER — Other Ambulatory Visit: Payer: Self-pay | Admitting: Cardiology

## 2021-08-18 ENCOUNTER — Other Ambulatory Visit: Payer: Self-pay | Admitting: Student

## 2021-09-14 ENCOUNTER — Other Ambulatory Visit: Payer: Self-pay | Admitting: Student

## 2021-09-14 ENCOUNTER — Other Ambulatory Visit: Payer: Self-pay | Admitting: Cardiology

## 2021-10-22 ENCOUNTER — Other Ambulatory Visit: Payer: Self-pay | Admitting: Cardiology

## 2021-10-31 ENCOUNTER — Other Ambulatory Visit: Payer: Self-pay | Admitting: Student

## 2021-11-02 DIAGNOSIS — R197 Diarrhea, unspecified: Secondary | ICD-10-CM | POA: Diagnosis not present

## 2021-11-02 DIAGNOSIS — A084 Viral intestinal infection, unspecified: Secondary | ICD-10-CM | POA: Diagnosis not present

## 2021-11-02 DIAGNOSIS — R112 Nausea with vomiting, unspecified: Secondary | ICD-10-CM | POA: Diagnosis not present

## 2021-12-22 DIAGNOSIS — M545 Low back pain, unspecified: Secondary | ICD-10-CM | POA: Diagnosis not present

## 2022-01-02 NOTE — Progress Notes (Deleted)
Cardiology Office Note    Date:  01/02/2022   ID:  Chelsea Petty September 19, 1985, MRN [id number]  PCP:  Benita Stabile, MD  Cardiologist: Nona Dell, MD    No chief complaint on file.   History of Present Illness:    Chelsea Petty is a 36 y.o. female with past medical history of CAD (s/p NSTEMI in 2014 with DES to D1 and DES to PL branch), HTN, HLD and obesity who presents to the office today for overdue follow-up.  She was last examined myself in 06/2020 and denied any recent anginal symptoms.  She did report having orthopnea and was having to sleep with more pillows at night.  Her BP had also been elevated when checked at home.  She was continued on ASA 81 mg daily, labetalol 400 mg twice daily, Crestor 20 mg daily, Losartan 100 mg daily and Spironolactone 50 mg daily with Chlorthalidone 25 mg daily being added to her medication regimen. She was informed to follow-up in 4 to 6 weeks but has not been evaluated since.  - labs from PCP  Past Medical History:  Diagnosis Date   Coronary artery disease    a. s/p NSTEMI in 2014 with DES to D1 and DES to PL branch   Essential hypertension    Hyperlipidemia     Past Surgical History:  Procedure Laterality Date   CORONARY STENT PLACEMENT      Current Medications: Outpatient Medications Prior to Visit  Medication Sig Dispense Refill   aspirin EC 81 MG tablet Take 1 tablet (81 mg total) by mouth daily. Swallow whole. 90 tablet 3   chlorthalidone (HYGROTON) 25 MG tablet TAKE 1 TABLET(25 MG) BY MOUTH DAILY 90 tablet 0   cloNIDine (CATAPRES) 0.1 MG tablet Take 0.1 mg by mouth as needed (systolic 170> diastolic 90>).      FLUoxetine (PROZAC) 20 MG capsule Take 1 capsule (20 mg total) by mouth daily. 30 capsule 3   labetalol (NORMODYNE) 200 MG tablet Take 1 tablet (200 mg total) by mouth 2 (two) times daily. Schedule an appointment with cardiology for further refills. 1st attempt 120 tablet 0   losartan (COZAAR) 100 MG  tablet TAKE 1 TABLET(100 MG) BY MOUTH DAILY 90 tablet 0   methocarbamol (ROBAXIN) 750 MG tablet Take 750-1,500 mg by mouth every 6 (six) hours as needed for muscle spasms.      nitroGLYCERIN (NITROSTAT) 0.4 MG SL tablet Place 0.4 mg under the tongue every 5 (five) minutes as needed for chest pain.      rosuvastatin (CRESTOR) 20 MG tablet Take 1 tablet (20 mg total) by mouth daily. KEEP OV. 30 tablet 2   spironolactone (ALDACTONE) 50 MG tablet TAKE 1 TABLET(50 MG) BY MOUTH DAILY 30 tablet 3   No facility-administered medications prior to visit.     Allergies:   Codeine and Hydrocodone   Social History   Socioeconomic History   Marital status: Single    Spouse name: Not on file   Number of children: Not on file   Years of education: Not on file   Highest education level: Not on file  Occupational History   Not on file  Tobacco Use   Smoking status: Every Day    Packs/day: 0.75    Years: 15.00    Pack years: 11.25    Types: Cigarettes   Smokeless tobacco: Never  Vaping Use   Vaping Use: Never used  Substance and Sexual Activity   Alcohol use:  No   Drug use: No   Sexual activity: Not on file  Other Topics Concern   Not on file  Social History Narrative   Not on file   Social Determinants of Health   Financial Resource Strain: Not on file  Food Insecurity: Not on file  Transportation Needs: Not on file  Physical Activity: Not on file  Stress: Not on file  Social Connections: Not on file     Family History:  The patient's ***family history includes Cancer in her mother; Diabetes in her brother, father, and paternal grandfather; Heart disease in her father; Hypertension in her father and mother; Other in her maternal grandfather, maternal grandmother, and paternal grandmother.   Review of Systems:    Please see the history of present illness.     All other systems reviewed and are otherwise negative except as noted above.   Physical Exam:    VS:  There were no  vitals taken for this visit.   General: Well developed, well nourished,female appearing in no acute distress. Head: Normocephalic, atraumatic. Neck: No carotid bruits. JVD not elevated.  Lungs: Respirations regular and unlabored, without wheezes or rales.  Heart: ***Regular rate and rhythm. No S3 or S4.  No murmur, no rubs, or gallops appreciated. Abdomen: Appears non-distended. No obvious abdominal masses. Msk:  Strength and tone appear normal for age. No obvious joint deformities or effusions. Extremities: No clubbing or cyanosis. No edema.  Distal pedal pulses are 2+ bilaterally. Neuro: Alert and oriented X 3. Moves all extremities spontaneously. No focal deficits noted. Psych:  Responds to questions appropriately with a normal affect. Skin: No rashes or lesions noted  Wt Readings from Last 3 Encounters:  07/01/20 279 lb 6.4 oz (126.7 kg)  04/02/20 267 lb 6.4 oz (121.3 kg)  03/25/20 266 lb 9.6 oz (120.9 kg)        Studies/Labs Reviewed:   EKG:  EKG is*** ordered today.  The ekg ordered today demonstrates ***  Recent Labs: No results found for requested labs within last 8760 hours.   Lipid Panel    Component Value Date/Time   LDLDIRECT 156.0 02/13/2020 1519    Additional studies/ records that were reviewed today include:   Echocardiogram: 02/2020 IMPRESSIONS     1. Left ventricular ejection fraction, by estimation, is 55 to 60%. The  left ventricle has normal function. The left ventricle has no regional  wall motion abnormalities. There is moderate left ventricular hypertrophy.  Left ventricular diastolic  parameters are consistent with Grade III diastolic dysfunction  (restrictive).   2. Right ventricular systolic function is normal. The right ventricular  size is normal. Tricuspid regurgitation signal is inadequate for assessing  PA pressure.   3. Left atrial size was moderately dilated.   4. The mitral valve is grossly normal. Mild mitral valve regurgitation.    5. The aortic valve was not well visualized, mild annular calcification.  Aortic valve regurgitation is not visualized.   6. The inferior vena cava is dilated in size with >50% respiratory  variability, suggesting right atrial pressure of 8 mmHg.   Assessment:    No diagnosis found.   Plan:   In order of problems listed above:  ***    Shared Decision Making/Informed Consent:   {Are you ordering a CV Procedure (e.g. stress test, cath, DCCV, TEE, etc)?   Press F2        :[id number]}    Medication Adjustments/Labs and Tests Ordered: Current medicines are reviewed at length with  the patient today.  Concerns regarding medicines are outlined above.  Medication changes, Labs and Tests ordered today are listed in the Patient Instructions below. There are no Patient Instructions on file for this visit.   Signed, Ellsworth LennoxBrittany M Siyona Coto, PA-C  01/02/2022 3:14 PM    [facility] [address] [place], [place] [address] Phone: [phone]) [phone] Fax: [phone]) [phone]

## 2022-01-05 ENCOUNTER — Encounter: Payer: Self-pay | Admitting: Student

## 2022-01-05 ENCOUNTER — Ambulatory Visit: Payer: BC Managed Care – PPO | Admitting: Student

## 2022-01-23 ENCOUNTER — Other Ambulatory Visit: Payer: Self-pay | Admitting: Cardiology

## 2022-01-25 DIAGNOSIS — M542 Cervicalgia: Secondary | ICD-10-CM | POA: Diagnosis not present

## 2022-01-25 DIAGNOSIS — M545 Low back pain, unspecified: Secondary | ICD-10-CM | POA: Diagnosis not present

## 2022-01-25 DIAGNOSIS — M25512 Pain in left shoulder: Secondary | ICD-10-CM | POA: Diagnosis not present

## 2022-02-09 ENCOUNTER — Ambulatory Visit (INDEPENDENT_AMBULATORY_CARE_PROVIDER_SITE_OTHER): Payer: BC Managed Care – PPO

## 2022-02-09 ENCOUNTER — Ambulatory Visit: Payer: BC Managed Care – PPO | Admitting: Orthopedic Surgery

## 2022-02-09 ENCOUNTER — Encounter: Payer: Self-pay | Admitting: Orthopedic Surgery

## 2022-02-09 VITALS — BP 169/107 | HR 91 | Ht 64.0 in | Wt 284.4 lb

## 2022-02-09 DIAGNOSIS — M542 Cervicalgia: Secondary | ICD-10-CM

## 2022-02-09 MED ORDER — PREDNISONE 10 MG (21) PO TBPK: ORAL_TABLET | ORAL | 0 refills | Status: DC

## 2022-02-09 NOTE — Patient Instructions (Signed)
Please provide a note for work - ok to return to work 6/26 without restrictions

## 2022-02-09 NOTE — Progress Notes (Signed)
New Patient Visit  Assessment: Chelsea Petty is a 36 y.o. female with the following: 1. Neck pain  Plan: Chelsea Petty has pain in the musculature in the area of the shoulder.  Specifically, she has tenderness within the trapezius.  She has full and painless range of motion of the left shoulder joint.  The presentation is most consistent with muscular pain around her neck.  Radiographs demonstrates some loss of the normal curvature, but otherwise no concerning findings.  Occasionally, she will have some pain in associated numbness and tingling to the left hand.  This is been ongoing for a year.  She has not worked with physical therapy recently.  We will place referral, and have her initiate some therapy, medications as needed.  I have also provided her with a prednisone Dosepak.  If she has further issues after initiating therapy, I am happy to see her in clinic.   The patient meets the AMA guidelines for Morbid obesity with BMI > 40.  The patient has been counseled on weight loss.     Follow-up: Return if symptoms worsen or fail to improve.  Subjective:  Chief Complaint  Patient presents with   New Patient (Initial Visit)   Shoulder Pain    LT shoulder/ NKI Painful x 1 year   Neck Pain    Starts in shoulder and radiates up to neck. Pain is bad enough to cause patient to have headaches    History of Present Illness: Chelsea Petty is a 36 y.o. female who has been referred by  Catalina Pizza, MD for evaluation of left shoulder pain.  She states that she has pain in superior aspect of the left shoulder.  She has no discomfort with range of motion of the left shoulder joint.  Occasionally, the pain will be severe enough, that it radiates proximally into the left side of her neck, causing her to have some headaches.  In addition, she occasionally has radiating pains into the left arm to her fingers.  She describes some mild numbness and tingling in her left hand when the pain  is severe.  She has taken NSAIDs.  She has tried therapy in the past, but this was more than a year ago.   Review of Systems: No fevers or chills No numbness or tingling No chest pain No shortness of breath No bowel or bladder dysfunction No GI distress No headaches   Medical History:  Past Medical History:  Diagnosis Date   Coronary artery disease    a. s/p NSTEMI in 2014 with DES to D1 and DES to PL branch   Essential hypertension    Hyperlipidemia     Past Surgical History:  Procedure Laterality Date   CORONARY STENT PLACEMENT      Family History  Problem Relation Age of Onset   Diabetes Paternal Grandfather    Other Paternal Grandmother        low blood sugar   Other Maternal Grandmother        gallstones-septic   Other Maternal Grandfather        heart issues   Hypertension Father    Heart disease Father    Diabetes Father    Hypertension Mother    Cancer Mother        ovarian   Diabetes Brother        borderline   Social History   Tobacco Use   Smoking status: Every Day    Packs/day: 0.75  Years: 15.00    Total pack years: 11.25    Types: Cigarettes   Smokeless tobacco: Never  Vaping Use   Vaping Use: Never used  Substance Use Topics   Alcohol use: No   Drug use: No    Allergies  Allergen Reactions   Codeine Nausea And Vomiting   Hydrocodone Nausea Only    Current Meds  Medication Sig   aspirin EC 81 MG tablet Take 1 tablet (81 mg total) by mouth daily. Swallow whole.   chlorthalidone (HYGROTON) 25 MG tablet TAKE 1 TABLET(25 MG) BY MOUTH DAILY   cloNIDine (CATAPRES) 0.1 MG tablet Take 0.1 mg by mouth as needed (systolic 170> diastolic 90>).    FLUoxetine (PROZAC) 20 MG capsule Take 1 capsule (20 mg total) by mouth daily.   labetalol (NORMODYNE) 200 MG tablet Take 1 tablet (200 mg total) by mouth 2 (two) times daily. Schedule an appointment with cardiology for further refills. 1st attempt   losartan (COZAAR) 100 MG tablet TAKE 1  TABLET(100 MG) BY MOUTH DAILY   predniSONE (STERAPRED UNI-PAK 21 TAB) 10 MG (21) TBPK tablet 10 mg DS 12 as directed   rosuvastatin (CRESTOR) 20 MG tablet Take 1 tablet (20 mg total) by mouth daily. KEEP OV.   spironolactone (ALDACTONE) 50 MG tablet TAKE 1 TABLET(50 MG) BY MOUTH DAILY    Objective: BP (!) 169/107   Pulse 91   Ht 5\' 4"  (1.626 m)   Wt 284 lb 6.4 oz (129 kg)   BMI 48.82 kg/m   Physical Exam:  General: Alert and oriented., No acute distress., and Obese female. Gait: Normal gait.  Left shoulder without deformity.  She has full painless range of motion.  5/5 strength.  Tenderness to palpation within the trapezius muscle.  Currently, sensation is intact throughout the left hand.  Fingers warm and well-perfused.  2+ radial pulse.  Minimal pain, with full range of motion of her neck.  IMAGING: I personally ordered and reviewed the following images  X-rays of the cervical spine were obtained in clinic today.  No acute injuries are noted.  Loss of the normal curvature.  There may be some slight kyphosis developing at the midpoint of the cervical spine.  Mild degenerative changes noted at the anterior aspect of C5-6.  Impression: Cervical spine x-ray without acute injury, mild degenerative changes noted.   New Medications:  Meds ordered this encounter  Medications   predniSONE (STERAPRED UNI-PAK 21 TAB) 10 MG (21) TBPK tablet    Sig: 10 mg DS 12 as directed    Dispense:  48 tablet    Refill:  0      , MD  02/10/2022 11:47 AM

## 2022-02-10 ENCOUNTER — Encounter: Payer: Self-pay | Admitting: Orthopedic Surgery

## 2022-03-02 ENCOUNTER — Other Ambulatory Visit: Payer: Self-pay | Admitting: Student

## 2022-03-02 ENCOUNTER — Ambulatory Visit (HOSPITAL_COMMUNITY): Payer: BC Managed Care – PPO | Attending: Orthopedic Surgery

## 2022-03-02 ENCOUNTER — Other Ambulatory Visit: Payer: Self-pay | Admitting: Cardiology

## 2022-03-09 ENCOUNTER — Ambulatory Visit (HOSPITAL_COMMUNITY): Payer: BC Managed Care – PPO

## 2022-03-30 ENCOUNTER — Other Ambulatory Visit: Payer: Self-pay | Admitting: Student

## 2022-04-18 DIAGNOSIS — M25521 Pain in right elbow: Secondary | ICD-10-CM | POA: Diagnosis not present

## 2022-04-26 ENCOUNTER — Other Ambulatory Visit: Payer: Self-pay | Admitting: Cardiology

## 2022-04-26 ENCOUNTER — Other Ambulatory Visit: Payer: Self-pay | Admitting: Student
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
## 2022-05-12 DIAGNOSIS — Z23 Encounter for immunization: Secondary | ICD-10-CM | POA: Diagnosis not present

## 2022-05-12 DIAGNOSIS — M542 Cervicalgia: Secondary | ICD-10-CM | POA: Diagnosis not present

## 2022-05-12 DIAGNOSIS — I10 Essential (primary) hypertension: Secondary | ICD-10-CM | POA: Diagnosis not present

## 2022-05-12 DIAGNOSIS — M25512 Pain in left shoulder: Secondary | ICD-10-CM | POA: Diagnosis not present

## 2022-05-24 ENCOUNTER — Telehealth: Payer: Self-pay | Admitting: Cardiology

## 2022-05-24 MED ORDER — LOSARTAN POTASSIUM 100 MG PO TABS: ORAL_TABLET | ORAL | 0 refills | Status: DC

## 2022-05-24 MED ORDER — LABETALOL HCL 200 MG PO TABS: 200.0000 mg | ORAL_TABLET | Freq: Two times a day (BID) | ORAL | 0 refills | Status: DC

## 2022-05-24 MED ORDER — CHLORTHALIDONE 25 MG PO TABS: ORAL_TABLET | ORAL | 0 refills | Status: DC

## 2022-05-24 MED ORDER — ROSUVASTATIN CALCIUM 20 MG PO TABS: 20.0000 mg | ORAL_TABLET | Freq: Every day | ORAL | 0 refills | Status: DC

## 2022-05-24 NOTE — Telephone Encounter (Addendum)
Seen 06/2020 and was to return in 4-6 weeks. Patient has cancelled and no-showed since.Has never had follow up.  [facility] [place] [employer]. I have refilled to cover to that appointment. Patient can then be evaluated after 2 years to see if medication/dosing is appropriate and if lab work needs to be done.

## 2022-05-24 NOTE — Telephone Encounter (Signed)
*  STAT* If patient is at the pharmacy, call can be transferred to refill team.   1. Which medications need to be refilled? (please list name of each medication and dose if known)   chlorthalidone (HYGROTON) 25 MG tablet    labetalol (NORMODYNE) 200 MG tablet    losartan (COZAAR) 100 MG tablet    rosuvastatin (CRESTOR) 20 MG tablet    spironolactone (ALDACTONE) 50 MG tablet    2. Which pharmacy/location (including street and city if local pharmacy) is medication to be sent to? [facility] [place], [place]  3. Do they need a 30 day or 90 day supply?  90 day   Pt has scheduled appt on 06/08/22. Please advise

## 2022-06-06 ENCOUNTER — Other Ambulatory Visit: Payer: Self-pay | Admitting: Student

## 2022-06-06 ENCOUNTER — Other Ambulatory Visit: Payer: Self-pay | Admitting: Cardiology

## 2022-06-07 ENCOUNTER — Telehealth: Payer: Self-pay | Admitting: Cardiology

## 2022-06-07 ENCOUNTER — Other Ambulatory Visit: Payer: Self-pay | Admitting: Student

## 2022-06-07 NOTE — Telephone Encounter (Signed)
Patient cancelled apt for tomorrow. I will refill enough to rescheduled apt next week.

## 2022-06-07 NOTE — Telephone Encounter (Signed)
*  STAT* If patient is at the pharmacy, call can be transferred to refill team.   1. Which medications need to be refilled? (please list name of each medication and dose if known)   spironolactone (ALDACTONE) 50 MG tablet  losartan (COZAAR) 100 MG tablet chlorthalidone (HYGROTON) 25 MG tablet  2. Which pharmacy/location (including street and city if local pharmacy) is medication to be sent to? [facility] [place], [address] [place]  3. Do they need a 30 day or 90 day supply? Patient needs enough medication until her appt on 06/15/22
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
## 2022-06-08 ENCOUNTER — Ambulatory Visit: Payer: BC Managed Care – PPO | Admitting: Nurse Practitioner

## 2022-06-09 DIAGNOSIS — J069 Acute upper respiratory infection, unspecified: Secondary | ICD-10-CM | POA: Diagnosis not present

## 2022-06-14 NOTE — Progress Notes (Deleted)
Office Visit    Patient Name: Chelsea Petty Date of Encounter: 06/14/2022  Primary Care Provider:  Celene Squibb, MD Primary Cardiologist:  Rozann Lesches, MD Primary Electrophysiologist: None  Chief Complaint    TEZRA MAHR is a 36 y.o. female with PMH of CAD (s/p NSTEMI in November 25, 2012 with DES to D1 and DES to PL branch), HTN, HLD, and obesity who presents today for overdue follow-up for coronary artery disease and hypertension.  Past Medical History    Past Medical History:  Diagnosis Date   Coronary artery disease    a. s/p NSTEMI in 11/25/12 with DES to D1 and DES to PL branch   Essential hypertension    Hyperlipidemia    Past Surgical History:  Procedure Laterality Date   CORONARY STENT PLACEMENT      Allergies  Allergies  Allergen Reactions   Codeine Nausea And Vomiting   Hydrocodone Nausea Only    History of Present Illness    Chelsea Petty  is a 36 year old female with the above mention past medical history who presents today for overdue follow-up of coronary artery disease and hypertension.  She was initially seen in 2019/11/26 during hospitalization for chest pain.  She is currently followed by Dr. Domenic Polite and was last seen by Bernerd Pho on 03/2020.  She was previously followed by [facility] cardiology since 11/25/2016.  During her hospitalization for chest pain patient's blood pressure was elevated at 200/133.  Patient had self discontinued her medications due to depression suffered after her husband died in [place] in 11-25-16.  She reported similar chest pain that occurred with her NSTEMI in 11/25/12 and patient's initial troponins were elevated with a BNP of 334.  Chest x-ray was completed that showed pulmonary edema and EKG was completed without ST abnormalities.  She was started on IV Lasix labetalol 300 mg 3 times daily.  2D echo was completed showing EF of 55-60%, no RWMA, moderate LVH, grade 3 DD with normal RV systolic function and moderately dilated LA.  Patient's  elevated troponin was due to demand ischemia from hypertensive urgency.  She was discharged with nadolol 40 mg twice daily, losartan 100 mg and spironolactone 50 mg daily.   She was seen in follow-up on 03/2020 following her hospitalization patient denied any exertional chest pain or shortness of breath.  She did endorse some lower extremity edema after returning to work at [employer].  She was restarted on ASA 81 mg with no adjustment to blood pressure medications during visit.  She was last seen 06/2020 for 89-month follow-up.  She was still having elevated blood pressures in the 150s to 170s at home.  Chlorthalidone 25 mg was started to her regimen with plan to follow-up for titration in 2 weeks.  Patient has been lost to follow-up since this visit.       Since last being seen in the office patient reports***.  Patient denies chest pain, palpitations, dyspnea, PND, orthopnea, nausea, vomiting, dizziness, syncope, edema, weight gain, or early satiety.       ***Notes: -Last LDL was 101 on 11/22 B/P not well controled can switch labetalol to coreg  Home Medications    Current Outpatient Medications  Medication Sig Dispense Refill   aspirin EC 81 MG tablet Take 1 tablet (81 mg total) by mouth daily. Swallow whole. 90 tablet 3   chlorthalidone (HYGROTON) 25 MG tablet TAKE 1 TABLET(25 MG) BY MOUTH DAILY 7 tablet 0   cloNIDine (CATAPRES) 0.1 MG  tablet Take 0.1 mg by mouth as needed (systolic 170> diastolic 90>).      FLUoxetine (PROZAC) 20 MG capsule Take 1 capsule (20 mg total) by mouth daily. 30 capsule 3   labetalol (NORMODYNE) 200 MG tablet Take 1 tablet (200 mg total) by mouth 2 (two) times daily. Schedule an appointment for further refills, 2nd attempt 60 tablet 0   losartan (COZAAR) 100 MG tablet TAKE 1 TABLET(100 MG) BY MOUTH DAILY 7 tablet 0   methocarbamol (ROBAXIN) 750 MG tablet Take 750-1,500 mg by mouth every 6 (six) hours as needed for muscle spasms.  (Patient not taking: Reported on  02/09/2022)     nitroGLYCERIN (NITROSTAT) 0.4 MG SL tablet Place 0.4 mg under the tongue every 5 (five) minutes as needed for chest pain.  (Patient not taking: Reported on 02/09/2022)     predniSONE (STERAPRED UNI-PAK 21 TAB) 10 MG (21) TBPK tablet 10 mg DS 12 as directed 48 tablet 0   rosuvastatin (CRESTOR) 20 MG tablet TAKE 1 TABLET(20 MG) BY MOUTH DAILY AT 6 PM 7 tablet 0   spironolactone (ALDACTONE) 50 MG tablet TAKE 1 TABLET(50 MG) BY MOUTH DAILY 7 tablet 0   No current facility-administered medications for this visit.     Review of Systems  Please see the history of present illness.    (+)*** (+)***  All other systems reviewed and are otherwise negative except as noted above.  Physical Exam    Wt Readings from Last 3 Encounters:  02/09/22 284 lb 6.4 oz (129 kg)  07/01/20 279 lb 6.4 oz (126.7 kg)  04/02/20 267 lb 6.4 oz (121.3 kg)   XA:JOINO were no vitals filed for this visit.,There is no height or weight on file to calculate BMI.  Constitutional:      Appearance: Healthy appearance. Not in distress.  Neck:     Vascular: JVD normal.  Pulmonary:     Effort: Pulmonary effort is normal.     Breath sounds: No wheezing. No rales. Diminished in the bases Cardiovascular:     Normal rate. Regular rhythm. Normal S1. Normal S2.      Murmurs: There is no murmur.  Edema:    Peripheral edema absent.  Abdominal:     Palpations: Abdomen is soft non tender. There is no hepatomegaly.  Skin:    General: Skin is warm and dry.  Neurological:     General: No focal deficit present.     Mental Status: Alert and oriented to person, place and time.     Cranial Nerves: Cranial nerves are intact.  EKG/LABS/Other Studies Reviewed    ECG personally reviewed by me today - ***  Risk Assessment/Calculations:   {Does this patient have ATRIAL FIBRILLATION?[phone]}        Lab Results  Component Value Date   WBC 11.7 (H) 02/28/2020   HGB 13.7 02/28/2020   HCT 44.9 02/28/2020   MCV  85.2 02/28/2020   PLT 329 02/28/2020   Lab Results  Component Value Date   CREATININE 0.84 02/28/2020   BUN 19 02/28/2020   NA 138 02/28/2020   K 3.6 02/28/2020   CL 105 02/28/2020   CO2 24 02/28/2020   Lab Results  Component Value Date   ALT 11 02/13/2020   AST 14 02/13/2020   ALKPHOS 93 02/13/2020   BILITOT 0.4 02/13/2020   Lab Results  Component Value Date   LDLDIRECT 156.0 02/13/2020    No results found for: "HGBA1C"  Assessment & Plan  1.Hx of CAD: -s/p NSTEMI in 2014 with DES toD1 and DES to PL branch. -Today patient reports*** -Continue ASA 81 mg, labetalol 40 mg twice daily and Crestor 20 mg daily   2.  HTN: -Patient's blood pressure today was***     3.  Hyperlipidemia: -Patient's last LDL cholesterol was 101 in 2022 above goal of less than 70 - 4.  Diastolic CHF: -Patient's last 2D echo was completed in 2021 with EF of 55 to 60% with moderate LVH and grade 3 DD. She did have mild mitral valve regurgitation but no significant valve abnormalities      Disposition: Follow-up with Nona Dell, MD or APP in *** months {Are you ordering a CV Procedure (e.g. stress test, cath, DCCV, TEE, etc)?   Press F2        :[id number]}   Medication Adjustments/Labs and Tests Ordered: Current medicines are reviewed at length with the patient today.  Concerns regarding medicines are outlined above.   Signed, Napoleon Form, Leodis Rains, NP 06/14/2022, 12:49 PM [facility]  Note:  This document was prepared using Dragon voice recognition software and may include unintentional dictation errors.

## 2022-06-15 ENCOUNTER — Ambulatory Visit: Payer: BC Managed Care – PPO | Admitting: Nurse Practitioner

## 2022-06-15 DIAGNOSIS — I25119 Atherosclerotic heart disease of native coronary artery with unspecified angina pectoris: Secondary | ICD-10-CM

## 2022-06-26 ENCOUNTER — Ambulatory Visit
Admission: EM | Admit: 2022-06-26 | Discharge: 2022-06-26 | Disposition: A | Discharge status: Home / Self Care | Payer: BC Managed Care – PPO | Attending: Family Medicine | Admitting: Family Medicine

## 2022-06-26 ENCOUNTER — Other Ambulatory Visit: Payer: Self-pay | Admitting: Student

## 2022-06-26 DIAGNOSIS — I509 Heart failure, unspecified: Secondary | ICD-10-CM | POA: Diagnosis not present

## 2022-06-26 DIAGNOSIS — R0602 Shortness of breath: Secondary | ICD-10-CM | POA: Diagnosis not present

## 2022-06-26 DIAGNOSIS — R600 Localized edema: Secondary | ICD-10-CM | POA: Diagnosis not present

## 2022-06-26 MED ORDER — CHLORTHALIDONE 25 MG PO TABS: 25.0000 mg | ORAL_TABLET | Freq: Every day | ORAL | 2 refills | Status: DC

## 2022-06-26 MED ORDER — ALBUTEROL SULFATE HFA 108 (90 BASE) MCG/ACT IN AERS: 1.0000 | INHALATION_SPRAY | Freq: Four times a day (QID) | RESPIRATORY_TRACT | 0 refills | Status: DC | PRN

## 2022-06-26 NOTE — ED Triage Notes (Signed)
Pt reports she think she has fluid on her lungs, coughing, coughing up flem, winded when walking which started Thursday. Took musinex dm OTC fluid pill. Pt reports she is breathing better today. Say she is breathing better today though.   Used an inhaler that wasn't hers and it helped a bit.

## 2022-06-29 ENCOUNTER — Other Ambulatory Visit: Payer: Self-pay

## 2022-06-29 ENCOUNTER — Ambulatory Visit
Admission: EM | Admit: 2022-06-29 | Discharge: 2022-06-29 | Disposition: A | Discharge status: Home / Self Care | Payer: BC Managed Care – PPO | Attending: Family Medicine | Admitting: Family Medicine

## 2022-06-29 ENCOUNTER — Ambulatory Visit (INDEPENDENT_AMBULATORY_CARE_PROVIDER_SITE_OTHER): Payer: BC Managed Care – PPO

## 2022-06-29 ENCOUNTER — Encounter: Payer: Self-pay | Admitting: Emergency Medicine

## 2022-06-29 DIAGNOSIS — R0602 Shortness of breath: Secondary | ICD-10-CM

## 2022-06-29 DIAGNOSIS — Z8679 Personal history of other diseases of the circulatory system: Secondary | ICD-10-CM | POA: Diagnosis not present

## 2022-06-29 DIAGNOSIS — R051 Acute cough: Secondary | ICD-10-CM | POA: Diagnosis not present

## 2022-06-29 MED ORDER — AZITHROMYCIN 250 MG PO TABS: ORAL_TABLET | ORAL | 0 refills | Status: DC

## 2022-06-29 NOTE — Discharge Instructions (Signed)
Your x-ray today showed a possible early atypical bacterial infection in your lungs, so for this reason I have started you on an antibiotic just in case you are developing some sort of pneumonia.  I want you to keep using your albuterol inhaler every 4 hours as needed, take Mucinex twice daily for the cough and congestion and follow-up as soon as possible with your cardiologist for a recheck.  If your symptoms significantly worsen at any point go to the emergency department.

## 2022-06-29 NOTE — ED Provider Notes (Signed)
[facility]    CSN: [id number] Arrival date & time: 06/29/22  1147      History   Chief Complaint Chief Complaint  Patient presents with   Shortness of Breath    HPI LAURAL EILAND is a 36 y.o. female.   Patient presenting today with ongoing shortness of breath mainly with exertion for the past week or so.  Has had a productive cough but no fevers, chills, chest pain, abdominal pain, nausea vomiting or diarrhea.  Was seen several days ago and restarted on her diuretics as she suspected her shortness of breath to be related to uncontrolled CHF as she had run out of her diuretics several days prior while awaiting her cardiology appointment scheduled for next week.  At that time she had some lower leg edema additionally.  States since restarting her chlorthalidone her legs have become less swollen and she overall feels better regarding her shortness of breath.  Has also been using her albuterol inhaler here and there with some mild relief of symptoms.    Past Medical History:  Diagnosis Date   Coronary artery disease    a. 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 Authorizing Provider  azithromycin (ZITHROMAX) 250 MG tablet Take first 2 tablets together, then 1 every day until finished. 06/29/22  Yes Particia Nearing, PA-C  albuterol (VENTOLIN HFA) 108 (90 Base) MCG/ACT inhaler Inhale 1-2 puffs into the lungs every 6 (six) hours as needed for wheezing or shortness of breath. 06/26/22   Particia Nearing, PA-C  aspirin EC 81 MG tablet Take 1 tablet (81 mg total) by mouth daily. Swallow whole. 03/25/20   Strader, Lennart Pall, PA-C  chlorthalidone (HYGROTON) 25 MG tablet TAKE 1 TABLET(25 MG) BY MOUTH DAILY 06/07/22   [place] Ouch, [place] M, PA-C  chlorthalidone (HYGROTON) 25 MG tablet Take 1 tablet (25 mg total) by mouth daily. 06/26/22   Particia Nearing, PA-C  cloNIDine (CATAPRES) 0.1 MG tablet Take 0.1 mg by mouth as needed (systolic 170> diastolic 90>).  03/06/20   [provider]  FLUoxetine (PROZAC) 20 MG capsule Take 1 capsule (20 mg total) by mouth daily. 02/13/20   Rodolph Bong, MD  labetalol (NORMODYNE) 200 MG tablet Take 1 tablet (200 mg total) by mouth 2 (two) times daily. Schedule  an appointment for further refills, 2nd attempt 06/07/22   [place] Ouch, Lennart Pall, PA-C  losartan (COZAAR) 100 MG tablet TAKE 1 TABLET(100 MG) BY MOUTH DAILY 06/07/22   [place] Ouch, [place] M, PA-C  methocarbamol (ROBAXIN) 750 MG tablet Take 750-1,500 mg by mouth every 6 (six) hours as needed for muscle spasms.  Patient not taking: Reported on 02/09/2022 03/06/20   [provider]  nitroGLYCERIN (NITROSTAT) 0.4 MG SL tablet Place 0.4 mg under the tongue every 5 (five) minutes as needed for chest pain.  Patient not taking: Reported on 02/09/2022 03/06/20   [provider]  predniSONE (STERAPRED UNI-PAK 21 TAB) 10 MG (21) TBPK tablet 10 mg DS 12 as directed 02/09/22   Oliver Barre, MD  rosuvastatin (CRESTOR) 20 MG tablet TAKE 1 TABLET(20 MG) BY MOUTH DAILY AT 6 PM 06/07/22   Strader, [place] M,  PA-C  spironolactone (ALDACTONE) 50 MG tablet TAKE 1 TABLET(50 MG) BY MOUTH DAILY 04/26/22   [place] Ouch, Lennart Pall, PA-C    Family History Family History  Problem Relation Age of Onset   Diabetes Paternal Grandfather    Other Paternal Grandmother        low blood sugar   Other Maternal Grandmother        gallstones-septic   Other Maternal Grandfather        heart issues   Hypertension Father    Heart disease Father    Diabetes Father    Hypertension Mother    Cancer Mother        ovarian   Diabetes Brother        borderline    Social History Social History   Tobacco Use   Smoking status: Every Day    Packs/day: 0.75    Years: 15.00    Total pack years: 11.25    Types: Cigarettes   Smokeless tobacco: Never  Vaping Use   Vaping Use: Never used  Substance Use Topics   Alcohol use: No   Drug use: No     Allergies   Patient has no active allergies.   Review of Systems Review of Systems Per HPI  Physical Exam Triage Vital Signs ED Triage Vitals [06/29/22 1249]  Enc Vitals Group     BP (!) 167/123     Pulse Rate 93     Resp 20     Temp 98.3 F (36.8 C)     Temp Source Oral     SpO2 95 %     Weight      Height      Head Circumference      Peak Flow      Pain Score 0     Pain Loc      Pain Edu?      Excl. in [place]?    No data found.  Updated Vital Signs BP (!) 167/123 (BP Location: Right Arm)   Pulse 93   Temp 98.3 F (36.8 C) (Oral)   Resp 20   LMP 06/27/2022 (Approximate)   SpO2 95%   Visual Acuity Right Eye Distance:   Left Eye Distance:   Bilateral Distance:    Right Eye Near:   Left Eye Near:    Bilateral Near:     Physical Exam Vitals and nursing note reviewed.  Constitutional:      Appearance: Normal appearance. She is not ill-appearing.  HENT:     Head: Atraumatic.     Nose: Nose normal.     Mouth/Throat:  Mouth: Mucous membranes are moist.  Eyes:     Extraocular Movements: Extraocular movements intact.      Conjunctiva/sclera: Conjunctivae normal.  Cardiovascular:     Rate and Rhythm: Normal rate and regular rhythm.     Heart sounds: Normal heart sounds.  Pulmonary:     Effort: Pulmonary effort is normal.     Breath sounds: Wheezing present. No rhonchi or rales.     Comments: Trace scattered wheezes Musculoskeletal:        General: No swelling. Normal range of motion.     Cervical back: Normal range of motion and neck supple.  Skin:    General: Skin is warm and dry.  Neurological:     Mental Status: She is alert and oriented to person, place, and time.     Motor: No weakness.     Gait: Gait normal.  Psychiatric:        Mood and Affect: Mood normal.        Thought Content: Thought content normal.        Judgment: Judgment normal.      UC Treatments / Results  Labs (all labs ordered are listed, but only abnormal results are displayed) Labs Reviewed - No data to display  EKG   Radiology DG Chest 2 View  Result Date: 06/29/2022 CLINICAL DATA:  Shortness of breath.  History of CHF EXAM: CHEST - 2 VIEW COMPARISON:  02/24/2020 FINDINGS: Heart is borderline in size. Mild interstitial prominence within the lungs could reflect early interstitial edema or atypical infection. No effusions or acute bony abnormality. IMPRESSION: Mild interstitial prominence could reflect interstitial edema or atypical infection. Electronically Signed   By: Charlett Nose M.D.   On: 06/29/2022 13:53    Procedures Procedures (including critical care time)  Medications Ordered in UC Medications - No data to display  Initial Impression / Assessment and Plan / UC Course  I have reviewed the triage vital signs and the nursing notes.  Pertinent labs & imaging results that were available during my care of the patient were reviewed by me and considered in my medical decision making (see chart for details).     X-ray today showing possible early atypical infection versus interstitial edema, will cover with  azithromycin, continue albuterol, Mucinex, fluids and continue chlorthalidone.  No evidence of a pleural effusion today.  EKG showing normal sinus rhythm at 84 bpm without obvious ST elevations.  She is scheduled to follow-up with her cardiologist next week, discussed ED if symptoms worsening prior to then.  Work note given per request.  Final Clinical Impressions(s) / UC Diagnoses   Final diagnoses:  Acute cough  SOB (shortness of breath)  History of chronic CHF     Discharge Instructions      Your x-ray today showed a possible early atypical bacterial infection in your lungs, so for this reason I have started you on an antibiotic just in case you are developing some sort of pneumonia.  I want you to keep using your albuterol inhaler every 4 hours as needed, take Mucinex twice daily for the cough and congestion and follow-up as soon as possible with your cardiologist for a recheck.  If your symptoms significantly worsen at any point go to the emergency department.    ED Prescriptions     Medication Sig Dispense Auth. Provider   azithromycin (ZITHROMAX) 250 MG tablet Take first 2 tablets together, then 1 every day until finished. 6 tablet Particia Nearing, [place]  PDMP not reviewed this encounter.   Particia Nearing, [place] 06/29/22 1432

## 2022-06-29 NOTE — ED Triage Notes (Signed)
Pt reports seen for same on Sunday and reports symptoms improving except for when lifting/bending over at work. Pt reports increased dyspnea with heavy lifting/exertion.

## 2022-06-30 NOTE — ED Provider Notes (Signed)
[facility]    CSN: [id number] Arrival date & time: 06/26/22  1543      History   Chief Complaint No chief complaint on file.   HPI Chelsea Petty is a 36 y.o. female.   Patient presenting today with several day history of productive cough, shortness of breath with exertion, mild chest tightness, lower legs swelling.  Concerned that she might have fluid on her lungs as she ran out of her diuretic about a week ago and cannot see her cardiologist for another 2 weeks.  Denies chest pain, wheezing, fever, chills, sore throat, abdominal pain, nausea vomiting diarrhea, orthopnea.  Tried a friend's inhaler which helped somewhat with her shortness of breath.  History of CHF, CAD, history of pleural effusion, hypertension, hyperlipidemia, cigarette smoker.    Past Medical History:  Diagnosis Date   Coronary artery disease    a. s/p NSTEMI in 2014 with DES to D1 and DES to PL branch   Essential hypertension    Hyperlipidemia     Patient Active Problem List   Diagnosis Date Noted   Elevated troponin    Acute on chronic diastolic HF (heart failure) (HCC)    Chest pain 02/24/2020   Hypertensive urgency 02/24/2020   Pulmonary edema 02/24/2020   Essential hypertension 07/28/2015   Amenorrhea 03/10/2015   Tobacco abuse 09/30/2013   CAD (coronary artery disease) 12/05/2012   Palpitations 12/05/2012   Malignant hypertension 11/12/2012   Obesity 11/12/2012    Past Surgical History:  Procedure Laterality Date   CORONARY STENT PLACEMENT      OB History     Gravida  0   Para  0   Term  0   Preterm  0   AB  0   Living  0      SAB  0   IAB  0   Ectopic  0   Multiple  0   Live Births  0            Home Medications    Prior to Admission medications   Medication Sig Start Date End Date Taking? Authorizing Provider  albuterol (VENTOLIN HFA) 108 (90 Base) MCG/ACT inhaler Inhale 1-2 puffs into the lungs every 6 (six) hours as needed for  wheezing or shortness of breath. 06/26/22  Yes Particia Nearing, PA-C  chlorthalidone (HYGROTON) 25 MG tablet Take 1 tablet (25 mg total) by mouth daily. 06/26/22  Yes Particia Nearing, PA-C  aspirin EC 81 MG tablet Take 1 tablet (81 mg total) by mouth daily. Swallow whole. 03/25/20   Strader, Lennart Pall, PA-C  azithromycin (ZITHROMAX) 250 MG tablet Take first 2 tablets together, then 1 every day until finished. 06/29/22   Particia Nearing, PA-C  chlorthalidone (HYGROTON) 25 MG tablet TAKE 1 TABLET(25 MG) BY MOUTH DAILY 06/07/22   Iran Ouch, [place] M, PA-C  cloNIDine (CATAPRES) 0.1 MG tablet Take 0.1 mg by mouth as needed (systolic 170> diastolic 90>).  03/06/20   [provider]  FLUoxetine (PROZAC) 20 MG capsule Take 1 capsule (20 mg total) by mouth daily. 02/13/20   Rodolph Bong, MD  labetalol (NORMODYNE) 200 MG tablet Take 1 tablet (200 mg total) by mouth 2 (two) times daily. Schedule an appointment for further refills, 2nd attempt 06/07/22   [place] Ouch, Lennart Pall, PA-C  losartan (COZAAR) 100 MG tablet TAKE 1 TABLET(100 MG) BY MOUTH DAILY 06/07/22   Strader, [place] M, PA-C  methocarbamol (ROBAXIN) 750 MG tablet Take 750-1,500 mg by  mouth every 6 (six) hours as needed for muscle spasms.  Patient not taking: Reported on 02/09/2022 03/06/20   [provider]  nitroGLYCERIN (NITROSTAT) 0.4 MG SL tablet Place 0.4 mg under the tongue every 5 (five) minutes as needed for chest pain.  Patient not taking: Reported on 02/09/2022 03/06/20   [provider]  predniSONE (STERAPRED UNI-PAK 21 TAB) 10 MG (21) TBPK tablet 10 mg DS 12 as directed 02/09/22   Mordecai Rasmussen, MD  rosuvastatin (CRESTOR) 20 MG tablet TAKE 1 TABLET(20 MG) BY MOUTH DAILY AT 6 PM 06/07/22   Strader, [place] M, PA-C  spironolactone (ALDACTONE) 50 MG tablet TAKE 1 TABLET(50 MG) BY MOUTH DAILY 04/26/22   Ahmed Prima, Fransisco Hertz, PA-C    Family History Family History  Problem Relation Age of Onset   Diabetes  Paternal Grandfather    Other Paternal Grandmother        low blood sugar   Other Maternal Grandmother        gallstones-septic   Other Maternal Grandfather        heart issues   Hypertension Father    Heart disease Father    Diabetes Father    Hypertension Mother    Cancer Mother        ovarian   Diabetes Brother        borderline    Social History Social History   Tobacco Use   Smoking status: Every Day    Packs/day: 0.75    Years: 15.00    Total pack years: 11.25    Types: Cigarettes   Smokeless tobacco: Never  Vaping Use   Vaping Use: Never used  Substance Use Topics   Alcohol use: No   Drug use: No     Allergies   Patient has no active allergies.   Review of Systems Review of Systems Per HPI  Physical Exam Triage Vital Signs ED Triage Vitals  Enc Vitals Group     BP 06/26/22 1551 (!) 167/100     Pulse Rate 06/26/22 1551 90     Resp 06/26/22 1551 20     Temp 06/26/22 1551 98.3 F (36.8 C)     Temp Source 06/26/22 1551 Oral     SpO2 06/26/22 1551 94 %     Weight --      Height --      Head Circumference --      Peak Flow --      Pain Score 06/26/22 1555 0     Pain Loc --      Pain Edu? --      Excl. in [place]? --    No data found.  Updated Vital Signs BP (!) 167/100 (BP Location: Right Arm)   Pulse 90   Temp 98.3 F (36.8 C) (Oral)   Resp 20   LMP  (Within Months)   SpO2 94%   Visual Acuity Right Eye Distance:   Left Eye Distance:   Bilateral Distance:    Right Eye Near:   Left Eye Near:    Bilateral Near:     Physical Exam Vitals and nursing note reviewed.  Constitutional:      Appearance: Normal appearance. She is not ill-appearing.  HENT:     Head: Atraumatic.     Mouth/Throat:     Mouth: Mucous membranes are moist.  Eyes:     Extraocular Movements: Extraocular movements intact.     Conjunctiva/sclera: Conjunctivae normal.  Cardiovascular:     Rate and  Rhythm: Normal rate and regular rhythm.     Heart sounds: Normal  heart sounds.  Pulmonary:     Effort: Pulmonary effort is normal. No respiratory distress.     Breath sounds: Normal breath sounds. No wheezing or rales.  Musculoskeletal:        General: Swelling present. Normal range of motion.     Cervical back: Normal range of motion and neck supple.     Comments: Trace lower leg edema bilaterally, symmetric  Skin:    General: Skin is warm and dry.  Neurological:     Mental Status: She is alert and oriented to person, place, and time.     Motor: No weakness.     Gait: Gait normal.     Comments: Bilateral lower extremities neurovascularly intact  Psychiatric:        Mood and Affect: Mood normal.        Thought Content: Thought content normal.        Judgment: Judgment normal.      UC Treatments / Results  Labs (all labs ordered are listed, but only abnormal results are displayed) Labs Reviewed - No data to display  EKG   Radiology DG Chest 2 View  Result Date: 06/29/2022 CLINICAL DATA:  Shortness of breath.  History of CHF EXAM: CHEST - 2 VIEW COMPARISON:  02/24/2020 FINDINGS: Heart is borderline in size. Mild interstitial prominence within the lungs could reflect early interstitial edema or atypical infection. No effusions or acute bony abnormality. IMPRESSION: Mild interstitial prominence could reflect interstitial edema or atypical infection. Electronically Signed   By: Rolm Baptise M.D.   On: 06/29/2022 13:53    Procedures Procedures (including critical care time)  Medications Ordered in UC Medications - No data to display  Initial Impression / Assessment and Plan / UC Course  I have reviewed the triage vital signs and the nursing notes.  Pertinent labs & imaging results that were available during my care of the patient were reviewed by me and considered in my medical decision making (see chart for details).     Hypertensive today but has been out of her medications for about a week.  Her exam overall is very reassuring today  with clear lung sounds bilaterally and oxygen saturation 94% on room air.  She appears in no acute distress.  Has a cardiology appointment scheduled for next week but will bridge her diuretics in the meantime to help with lower leg edema.  Will defer x-ray imaging today with shared decision making given stable vital signs and clear exam, monitor for improvement in shortness of breath once back on medications and will provide an albuterol inhaler as she states her friend's inhaler did help her with her cough and shortness of breath.  Return for any worsening symptoms.  Final Clinical Impressions(s) / UC Diagnoses   Final diagnoses:  SOB (shortness of breath)  Chronic congestive heart failure, unspecified heart failure type [facility])  Lower extremity edema   Discharge Instructions   None    ED Prescriptions     Medication Sig Dispense Auth. Provider   chlorthalidone (HYGROTON) 25 MG tablet Take 1 tablet (25 mg total) by mouth daily. 30 tablet Volney American, [place]   albuterol (VENTOLIN HFA) 108 (90 Base) MCG/ACT inhaler Inhale 1-2 puffs into the lungs every 6 (six) hours as needed for wheezing or shortness of breath. 18 g Volney American, [place]      PDMP not reviewed this encounter.   Merrie Roof  [place], PA-C 06/30/22 0801

## 2022-07-10 ENCOUNTER — Other Ambulatory Visit: Payer: Self-pay | Admitting: Student

## 2022-07-11 ENCOUNTER — Other Ambulatory Visit: Payer: Self-pay | Admitting: Student

## 2022-07-13 NOTE — Progress Notes (Deleted)
Cardiology Office Note:    Date:  07/13/2022   ID:  Chelsea Petty, DOB 01-22-1986, MRN [id number]  PCP:  Benita Stabile, MD   [facility] Providers Cardiologist:  Nona Dell, MD { Click to update primary MD,subspecialty MD or APP then REFRESH:1}  *** Referring MD: Benita Stabile, MD   Chief Complaint:  No chief complaint on file. {Click here for Visit Info    :1}    History of Present Illness:   Chelsea Petty is a 36 y.o. female with history of CAD NSTEMI 2014 DES D1 and DES PL branch, HTN, HLD, obesity. Echo 02/2020 normal LVEF grade 3 DD.  Patient last saw Ms. Strader, PA-C 06/2020 and BP high-chlorthalidone added.   In ED 06/26/22 with chest pain, dyspnea. Leg swelling and cough. BP up after being out of meds for a week.  Back in ED 06/29/22 with cough and SOB and xray showed possible early atypical infection vx edema. Was given azithromycin, mucinex, albuteral and fluids.      Past Medical History:  Diagnosis Date   Coronary artery disease    a. s/p NSTEMI in 2014 with DES to D1 and DES to PL branch   Essential hypertension    Hyperlipidemia    Current Medications: No outpatient medications have been marked as taking for the 07/27/22 encounter (Appointment) with Dyann Kief, PA-C.    Allergies:   Patient has no active allergies.   Social History   Tobacco Use   Smoking status: Every Day    Packs/day: 0.75    Years: 15.00    Total pack years: 11.25    Types: Cigarettes   Smokeless tobacco: Never  Vaping Use   Vaping Use: Never used  Substance Use Topics   Alcohol use: No   Drug use: No    Family Hx: The patient's family history includes Cancer in her mother; Diabetes in her brother, father, and paternal grandfather; Heart disease in her father; Hypertension in her father and mother; Other in her maternal grandfather, maternal grandmother, and paternal grandmother.  ROS     Physical Exam:    VS:  LMP 06/27/2022 (Approximate)      Wt Readings from Last 3 Encounters:  02/09/22 284 lb 6.4 oz (129 kg)  07/01/20 279 lb 6.4 oz (126.7 kg)  04/02/20 267 lb 6.4 oz (121.3 kg)    Physical Exam  GEN: Well nourished, well developed, in no acute distress  HEENT: normal  Neck: no JVD, carotid bruits, or masses Cardiac:RRR; no murmurs, rubs, or gallops  Respiratory:  clear to auscultation bilaterally, normal work of breathing GI: soft, nontender, nondistended, + BS Ext: without cyanosis, clubbing, or edema, Good distal pulses bilaterally MS: no deformity or atrophy  Skin: warm and dry, no rash Neuro:  Alert and Oriented x 3, Strength and sensation are intact Psych: euthymic mood, full affect        EKGs/Labs/Other Test Reviewed:    EKG:  EKG is *** ordered today.  The ekg ordered today demonstrates ***  Recent Labs: No results found for requested labs within last 365 days.   Recent Lipid Panel No results for input(s): "CHOL", "TRIG", "HDL", "VLDL", "LDLCALC", "LDLDIRECT" in the last 8760 hours.   Prior CV Studies: {Select studies to display:26339}  Echocardiogram: 02/2020 IMPRESSIONS     1. Left ventricular ejection fraction, by estimation, is 55 to 60%. The  left ventricle has normal function. The left ventricle has no regional  wall motion abnormalities.  There is moderate left ventricular hypertrophy.  Left ventricular diastolic  parameters are consistent with Grade III diastolic dysfunction  (restrictive).   2. Right ventricular systolic function is normal. The right ventricular  size is normal. Tricuspid regurgitation signal is inadequate for assessing  PA pressure.   3. Left atrial size was moderately dilated.   4. The mitral valve is grossly normal. Mild mitral valve regurgitation.   5. The aortic valve was not well visualized, mild annular calcification.  Aortic valve regurgitation is not visualized.   6. The inferior vena cava is dilated in size with >50% respiratory  variability, suggesting  right atrial pressure of 8 mmHg.      Risk Assessment/Calculations/Metrics:   {Does this patient have ATRIAL FIBRILLATION?:[phone]}     No BP recorded.  {Refresh Note OR Click here to enter BP  :1}***    ASSESSMENT & PLAN:   No problem-specific Assessment & Plan notes found for this encounter.    CAD NSTEMI 2014 DES D1 and DES PL branch  Chronic diastolic CHF  HTN-was out of meds for a week but restarted in ED.  HLD        {Are you ordering a CV Procedure (e.g. stress test, cath, DCCV, TEE, etc)?   Press F2        :[id number]   Dispo:  No follow-ups on file.   Medication Adjustments/Labs and Tests Ordered: Current medicines are reviewed at length with the patient today.  Concerns regarding medicines are outlined above.  Tests Ordered: No orders of the defined types were placed in this encounter.  Medication Changes: No orders of the defined types were placed in this encounter.  Sumner Boast, PA-C  07/13/2022 8:04 AM    [address], [place], [place]  [address] Phone: [phone]; Fax: [phone]

## 2022-07-22 ENCOUNTER — Encounter: Payer: Self-pay | Admitting: Emergency Medicine

## 2022-07-22 ENCOUNTER — Ambulatory Visit
Admission: EM | Admit: 2022-07-22 | Discharge: 2022-07-22 | Disposition: A | Discharge status: Home / Self Care | Payer: BC Managed Care – PPO | Attending: Family Medicine | Admitting: Family Medicine

## 2022-07-22 DIAGNOSIS — R059 Cough, unspecified: Secondary | ICD-10-CM | POA: Diagnosis not present

## 2022-07-22 DIAGNOSIS — Z79899 Other long term (current) drug therapy: Secondary | ICD-10-CM | POA: Insufficient documentation

## 2022-07-22 DIAGNOSIS — U071 COVID-19: Secondary | ICD-10-CM | POA: Insufficient documentation

## 2022-07-22 DIAGNOSIS — J069 Acute upper respiratory infection, unspecified: Secondary | ICD-10-CM | POA: Diagnosis not present

## 2022-07-22 LAB — RESP PANEL BY RT-PCR (FLU A&B, COVID) ARPGX2
Influenza A by PCR: NEGATIVE
Influenza B by PCR: NEGATIVE
SARS Coronavirus 2 by RT PCR: POSITIVE — AB

## 2022-07-22 MED ORDER — PROMETHAZINE-DM 6.25-15 MG/5ML PO SYRP: 5.0000 mL | ORAL_SOLUTION | Freq: Four times a day (QID) | ORAL | 0 refills | Status: DC | PRN

## 2022-07-22 NOTE — ED Triage Notes (Signed)
Body aches, nasal congestion, cough, headache, chills since Wednesday.

## 2022-07-22 NOTE — ED Provider Notes (Signed)
[facility]    CSN: [id number] Arrival date & time: 07/22/22  3818      History   Chief Complaint No chief complaint on file.   HPI Chelsea Petty is a 36 y.o. female.   Presenting today with 2-day history of fever, chills, body aches, chills, sweats, congestion, cough, headache, fatigue.  Denies chest pain, shortness of breath, abdominal pain, nausea vomiting or diarrhea.  Taking over-the-counter cold and congestion medications with mild relief.  No known sick contacts recently.      Past Medical History:  Diagnosis Date   Coronary artery disease    a. s/p NSTEMI in 2014 with DES to D1 and DES to PL branch   Essential hypertension    Hyperlipidemia     Patient Active Problem List   Diagnosis Date Noted   Elevated troponin    Acute on chronic diastolic HF (heart failure) (HCC)    Chest pain 02/24/2020   Hypertensive urgency 02/24/2020   Pulmonary edema 02/24/2020   Essential hypertension 07/28/2015   Amenorrhea 03/10/2015   Tobacco abuse 09/30/2013   CAD (coronary artery disease) 12/05/2012   Palpitations 12/05/2012   Malignant hypertension 11/12/2012   Obesity 11/12/2012    Past Surgical History:  Procedure Laterality Date   CORONARY STENT PLACEMENT      OB History     Gravida  0   Para  0   Term  0   Preterm  0   AB  0   Living  0      SAB  0   IAB  0   Ectopic  0   Multiple  0   Live Births  0            Home Medications    Prior to Admission medications   Medication Sig Start Date End Date Taking? Authorizing Provider  promethazine-dextromethorphan (PROMETHAZINE-DM) 6.25-15 MG/5ML syrup Take 5 mLs by mouth 4 (four) times daily as needed. 07/22/22  Yes Particia Nearing, PA-C  albuterol (VENTOLIN HFA) 108 (90 Base) MCG/ACT inhaler Inhale 1-2 puffs into the lungs every 6 (six) hours as needed for wheezing or shortness of breath. 06/26/22   Particia Nearing, PA-C  aspirin EC 81 MG tablet Take 1 tablet  (81 mg total) by mouth daily. Swallow whole. 03/25/20   Strader, Lennart Pall, PA-C  azithromycin (ZITHROMAX) 250 MG tablet Take first 2 tablets together, then 1 every day until finished. 06/29/22   Particia Nearing, PA-C  chlorthalidone (HYGROTON) 25 MG tablet TAKE 1 TABLET(25 MG) BY MOUTH DAILY 06/07/22   Strader, [place] M, PA-C  chlorthalidone (HYGROTON) 25 MG tablet Take 1 tablet (25 mg total) by mouth daily. 06/26/22   Particia Nearing, PA-C  cloNIDine (CATAPRES) 0.1 MG tablet Take 0.1 mg by mouth as needed (systolic 170> diastolic 90>).  03/06/20   [provider]  FLUoxetine (PROZAC) 20 MG capsule Take 1 capsule (20 mg total) by mouth daily. 02/13/20   Rodolph Bong, MD  labetalol (NORMODYNE) 200 MG tablet TAKE 1 TABLET(200 MG) BY MOUTH TWICE DAILY 07/11/22   [place] Ouch, Lennart Pall, PA-C  losartan (COZAAR) 100 MG tablet TAKE 1 TABLET(100 MG) BY MOUTH DAILY 06/07/22   Strader, [place] M, PA-C  methocarbamol (ROBAXIN) 750 MG tablet Take 750-1,500 mg by mouth every 6 (six) hours as needed for muscle spasms.  Patient not taking: Reported on 02/09/2022 03/06/20   [provider]  nitroGLYCERIN (NITROSTAT) 0.4 MG SL tablet Place 0.4 mg  under the tongue every 5 (five) minutes as needed for chest pain.  Patient not taking: Reported on 02/09/2022 03/06/20   [provider]  predniSONE (STERAPRED UNI-PAK 21 TAB) 10 MG (21) TBPK tablet 10 mg DS 12 as directed 02/09/22   Oliver Barre, MD  rosuvastatin (CRESTOR) 20 MG tablet TAKE 1 TABLET(20 MG) BY MOUTH DAILY AT 6 PM 06/07/22   Strader, [place] M, PA-C  spironolactone (ALDACTONE) 50 MG tablet TAKE 1 TABLET(50 MG) BY MOUTH DAILY 04/26/22   [place] Ouch, Lennart Pall, PA-C    Family History Family History  Problem Relation Age of Onset   Diabetes Paternal Grandfather    Other Paternal Grandmother        low blood sugar   Other Maternal Grandmother        gallstones-septic   Other Maternal Grandfather        heart issues    Hypertension Father    Heart disease Father    Diabetes Father    Hypertension Mother    Cancer Mother        ovarian   Diabetes Brother        borderline    Social History Social History   Tobacco Use   Smoking status: Every Day    Packs/day: 0.75    Years: 15.00    Total pack years: 11.25    Types: Cigarettes   Smokeless tobacco: Never  Vaping Use   Vaping Use: Never used  Substance Use Topics   Alcohol use: No   Drug use: No     Allergies   Patient has no known allergies.   Review of Systems Review of Systems Per HPI  Physical Exam Triage Vital Signs ED Triage Vitals  Enc Vitals Group     BP 07/22/22 0944 106/73     Pulse Rate 07/22/22 0944 79     Resp 07/22/22 0944 18     Temp 07/22/22 0944 98.5 F (36.9 C)     Temp Source 07/22/22 0944 Oral     SpO2 07/22/22 0944 95 %     Weight --      Height --      Head Circumference --      Peak Flow --      Pain Score 07/22/22 0945 3     Pain Loc --      Pain Edu? --      Excl. in GC? --    No data found.  Updated Vital Signs BP 106/73 (BP Location: Right Arm)   Pulse 79   Temp 98.5 F (36.9 C) (Oral)   Resp 18   LMP 06/27/2022 (Approximate)   SpO2 95%   Visual Acuity Right Eye Distance:   Left Eye Distance:   Bilateral Distance:    Right Eye Near:   Left Eye Near:    Bilateral Near:     Physical Exam Vitals and nursing note reviewed.  Constitutional:      Appearance: Normal appearance.  HENT:     Head: Atraumatic.     Right Ear: Tympanic membrane and external ear normal.     Left Ear: Tympanic membrane and external ear normal.     Nose: Congestion present.     Mouth/Throat:     Mouth: Mucous membranes are moist.     Pharynx: Posterior oropharyngeal erythema present.  Eyes:     Extraocular Movements: Extraocular movements intact.     Conjunctiva/sclera: Conjunctivae normal.  Cardiovascular:     Rate and  Rhythm: Normal rate and regular rhythm.     Heart sounds: Normal heart sounds.   Pulmonary:     Effort: Pulmonary effort is normal.     Breath sounds: Normal breath sounds. No wheezing or rales.  Musculoskeletal:        General: Normal range of motion.     Cervical back: Normal range of motion and neck supple.  Skin:    General: Skin is warm and dry.  Neurological:     Mental Status: She is alert and oriented to person, place, and time.  Psychiatric:        Mood and Affect: Mood normal.        Thought Content: Thought content normal.      UC Treatments / Results  Labs (all labs ordered are listed, but only abnormal results are displayed) Labs Reviewed  RESP PANEL BY RT-PCR (FLU A&B, COVID) [id number]    EKG   Radiology No results found.  Procedures Procedures (including critical care time)  Medications Ordered in UC Medications - No data to display  Initial Impression / Assessment and Plan / UC Course  I have reviewed the triage vital signs and the nursing notes.  Pertinent labs & imaging results that were available during my care of the patient were reviewed by me and considered in my medical decision making (see chart for details).     Vitals and exam overall reassuring and suggestive of a viral upper respiratory infection.  Respiratory panel pending, good candidate for antiviral therapy if positive for either COVID or flu.  Molnupiravir or Tamiflu.  Discussed Phenergan DM, supportive over-the-counter medications and home care as well.  Return for worsening symptoms.  Work note given.  Final Clinical Impressions(s) / UC Diagnoses   Final diagnoses:  Viral URI with cough   Discharge Instructions   None    ED Prescriptions     Medication Sig Dispense Auth. Provider   promethazine-dextromethorphan (PROMETHAZINE-DM) 6.25-15 MG/5ML syrup Take 5 mLs by mouth 4 (four) times daily as needed. 100 mL Particia Nearing, [place]      PDMP not reviewed this encounter.   Particia Nearing, [place] 07/22/22 1015

## 2022-07-25 ENCOUNTER — Telehealth (HOSPITAL_COMMUNITY): Payer: Self-pay | Admitting: Emergency Medicine

## 2022-07-25 MED ORDER — MOLNUPIRAVIR EUA 200MG CAPSULE: 4.0000 | ORAL_CAPSULE | Freq: Two times a day (BID) | ORAL | 0 refills | Status: AC

## 2022-07-27 ENCOUNTER — Ambulatory Visit: Payer: BC Managed Care – PPO | Admitting: Physician Assistant

## 2022-08-10 ENCOUNTER — Other Ambulatory Visit: Payer: Self-pay | Admitting: Student

## 2022-09-01 ENCOUNTER — Other Ambulatory Visit: Payer: Self-pay | Admitting: Student

## 2022-09-01 ENCOUNTER — Telehealth: Payer: Self-pay | Admitting: Cardiology

## 2022-09-01 MED ORDER — LABETALOL HCL 200 MG PO TABS: ORAL_TABLET | ORAL | 0 refills | Status: DC

## 2022-09-01 MED ORDER — SPIRONOLACTONE 50 MG PO TABS: ORAL_TABLET | ORAL | 0 refills | Status: DC

## 2022-09-01 MED ORDER — LOSARTAN POTASSIUM 100 MG PO TABS: ORAL_TABLET | ORAL | 0 refills | Status: DC

## 2022-09-01 NOTE — Telephone Encounter (Signed)
*  STAT* If patient is at the pharmacy, call can be transferred to refill team.   1. Which medications need to be refilled? (please list name of each medication and dose if known) spironolactone (ALDACTONE) 50 MG tablet   labetalol (NORMODYNE) 200 MG tablet TAKE 1 TABLET(200 MG) BY MOUTH TWICE DAILY   losartan (COZAAR) 100 MG tablet TAKE 1 TABLET(100 MG) BY MOUTH DAILY    2. Which pharmacy/location (including street and city if local pharmacy) is medication to be sent to?   [facility] [place], [place]    3. Do they need a 30 day or 90 day supply? 30   Pt r/s for 09/21/22

## 2022-09-01 NOTE — Telephone Encounter (Signed)
Completed.

## 2022-09-07 DIAGNOSIS — J069 Acute upper respiratory infection, unspecified: Secondary | ICD-10-CM | POA: Diagnosis not present

## 2022-09-07 DIAGNOSIS — R197 Diarrhea, unspecified: Secondary | ICD-10-CM | POA: Diagnosis not present

## 2022-09-21 ENCOUNTER — Other Ambulatory Visit
Admission: RE | Admit: 2022-09-21 | Discharge: 2022-09-21 | Disposition: A | Discharge status: Home / Self Care | Payer: BC Managed Care – PPO | Source: Ambulatory Visit | Attending: Cardiology | Admitting: Cardiology

## 2022-09-21 ENCOUNTER — Encounter: Payer: Self-pay | Admitting: Cardiology

## 2022-09-21 ENCOUNTER — Ambulatory Visit (INDEPENDENT_AMBULATORY_CARE_PROVIDER_SITE_OTHER): Payer: BC Managed Care – PPO

## 2022-09-21 ENCOUNTER — Ambulatory Visit: Payer: BC Managed Care – PPO | Attending: Nurse Practitioner | Admitting: Cardiology

## 2022-09-21 VITALS — BP 138/80 | HR 80 | Ht 64.5 in | Wt 279.8 lb

## 2022-09-21 DIAGNOSIS — I10 Essential (primary) hypertension: Secondary | ICD-10-CM | POA: Diagnosis not present

## 2022-09-21 DIAGNOSIS — E782 Mixed hyperlipidemia: Secondary | ICD-10-CM

## 2022-09-21 DIAGNOSIS — R002 Palpitations: Secondary | ICD-10-CM | POA: Diagnosis not present

## 2022-09-21 DIAGNOSIS — Z79899 Other long term (current) drug therapy: Secondary | ICD-10-CM

## 2022-09-21 DIAGNOSIS — I25119 Atherosclerotic heart disease of native coronary artery with unspecified angina pectoris: Secondary | ICD-10-CM

## 2022-09-21 LAB — BASIC METABOLIC PANEL
Anion gap: 12 (ref 5–15)
BUN: 17 mg/dL (ref 6–20)
CO2: 21 mmol/L — ABNORMAL LOW (ref 22–32)
Calcium: 9.1 mg/dL (ref 8.9–10.3)
Chloride: 102 mmol/L (ref 98–111)
Creatinine, Ser: 1.13 mg/dL — ABNORMAL HIGH (ref 0.44–1.00)
GFR, Estimated: >60 mL/min (ref >60)
Glucose, Bld: 263 mg/dL — ABNORMAL HIGH (ref 70–99)
Potassium: 3.6 mmol/L (ref 3.5–5.1)
Sodium: 135 mmol/L (ref 135–145)

## 2022-09-21 LAB — CBC WITH DIFFERENTIAL/PLATELET
Abs Immature Granulocytes: 0.03 10*3/uL (ref 0.00–0.07)
Basophils Absolute: 0.1 10*3/uL (ref 0.0–0.1)
Basophils Relative: 1 %
Eosinophils Absolute: 0.1 10*3/uL (ref 0.0–0.5)
Eosinophils Relative: 1 %
HCT: 45 % (ref 36.0–46.0)
Hemoglobin: 14.8 g/dL (ref 12.0–15.0)
Immature Granulocytes: 0 %
Lymphocytes Relative: 26 %
Lymphs Abs: 2.9 10*3/uL (ref 0.7–4.0)
MCH: 28.1 pg (ref 26.0–34.0)
MCHC: 32.9 g/dL (ref 30.0–36.0)
MCV: 85.6 fL (ref 80.0–100.0)
Monocytes Absolute: 0.5 10*3/uL (ref 0.1–1.0)
Monocytes Relative: 5 %
Neutro Abs: 7.7 10*3/uL (ref 1.7–7.7)
Neutrophils Relative %: 67 %
Platelets: 270 10*3/uL (ref 150–400)
RBC: 5.26 MIL/uL — ABNORMAL HIGH (ref 3.87–5.11)
RDW: 13.2 % (ref 11.5–15.5)
WBC: 11.3 10*3/uL — ABNORMAL HIGH (ref 4.0–10.5)
nRBC: 0 % (ref 0.0–0.2)

## 2022-09-21 LAB — LIPID PANEL
Cholesterol: 242 mg/dL — ABNORMAL HIGH (ref 0–200)
HDL: 40 mg/dL — ABNORMAL LOW (ref >40)
LDL Cholesterol: 160 mg/dL — ABNORMAL HIGH (ref 0–99)
Total CHOL/HDL Ratio: 6.1 RATIO
Triglycerides: 208 mg/dL — ABNORMAL HIGH (ref <150)
VLDL: 42 mg/dL — ABNORMAL HIGH (ref 0–40)

## 2022-09-21 MED ORDER — NITROGLYCERIN 0.4 MG SL SUBL: 0.4000 mg | SUBLINGUAL_TABLET | SUBLINGUAL | 3 refills | Status: AC | PRN

## 2022-09-21 NOTE — Patient Instructions (Signed)
Medication Instructions:  Your physician recommends that you continue on your current medications as directed. Please refer to the Current Medication list given to you today.   *If you need a refill on your cardiac medications before your next appointment, please call your pharmacy*   Lab Work: Your physician recommends that you return for lab work in: Today ( BMET, Lipid, CBC)   If you have labs (blood work) drawn today and your tests are completely normal, you will receive your results only by: MyChart Message (if you have MyChart) OR A paper copy in the mail If you have any lab test that is abnormal or we need to change your treatment, we will call you to review the results.   Testing/Procedures: Bryn [place]ull[place]- Long Term Monitor Instructions   Your physician has requested you wear your ZIO patch monitor___14__days. You may remove on October 05, 2022.   This is a single patch monitor.  [employer] supplies one patch monitor per enrollment.  Additional stickers are not available.   Please do not apply patch if you will be having a Nuclear Stress Test, Echocardiogram, Cardiac CT, MRI, or Chest Xray during the time frame you would be wearing the monitor. The patch cannot be worn during these tests.  You cannot remove and re-apply the ZIO XT patch monitor.   Your ZIO patch monitor will be sent USPS Priority mail from [facility] directly to your home address. The monitor may also be mailed to a PO BOX if home delivery is not available.   It may take 3-5 days to receive your monitor after you have been enrolled.   Once you have received you monitor, please review enclosed instructions.  Your monitor has already been registered assigning a specific monitor serial # to you.   Applying the monitor   Shave hair from upper left chest.   Hold abrader disc by orange tab.  Rub abrader in 40 strokes over left upper chest as indicated in your monitor instructions.   Clean area with 4 enclosed  alcohol pads .  Use all pads to assure are is cleaned thoroughly.  Let dry.   Apply patch as indicated in monitor instructions.  Patch will be place under collarbone on left side of chest with arrow pointing upward.   Rub patch adhesive wings for 2 minutes.Remove white label marked "1".  Remove white label marked "2".  Rub patch adhesive wings for 2 additional minutes.   While looking in a mirror, press and release button in center of patch.  A small green light will flash 3-4 times .  This will be your only indicator the monitor has been turned on.     Do not shower for the first 24 hours.  You may shower after the first 24 hours.   Press button if you feel a symptom. You will hear a small click.  Record Date, Time and Symptom in the Patient Log Book.   When you are ready to remove patch, follow instructions on last 2 pages of Patient Log Book.  Stick patch monitor onto last page of Patient Log Book.   Place Patient Log Book in [place] box.  Use locking tab on box and tape box closed securely.  The [employer] and [employer] has [employer] on it.  Please place in mailbox as soon as possible.  Your physician should have your test results approximately 7 days after the monitor has been mailed back to [facility].   Call [employer]  [facility] at [phone] if you have questions regarding your ZIO XT patch monitor.  Call them immediately if you see an orange light blinking on your monitor.   If your monitor falls off in less than 4 days contact our Monitor department at [phone].  If your monitor becomes loose or falls off after 4 days call [employer] at [phone] for suggestions on securing your monitor.     Follow-Up: At [facility], you and your health needs are our priority.  As part of our continuing mission to provide you with exceptional heart care, we have created designated Provider Care Teams.  These Care Teams include your primary Cardiologist (physician) and  Advanced Practice Providers (APPs -  Physician Assistants and Nurse Practitioners) who all work together to provide you with the care you need, when you need it.  We recommend signing up for the patient portal called "MyChart".  Sign up information is provided on this After Visit Summary.  MyChart is used to connect with patients for Virtual Visits (Telemedicine).  Patients are able to view lab/test results, encounter notes, upcoming appointments, etc.  Non-urgent messages can be sent to your provider as well.   To learn more about what you can do with MyChart, go to [email or web address].    Your next appointment:   2 month(s)  Provider:   You may see Rozann Lesches, MD or one of the following Advanced Practice Providers on your designated Care Team:   Bernerd Pho, PA-C  Ermalinda Barrios, PA-C     Other Instructions Thank you for choosing [facility]!

## 2022-09-21 NOTE — Progress Notes (Deleted)
Cardiology Clinic Note   Patient Name: Chelsea Petty Date of Encounter: 09/21/2022  Primary Care Provider:  Celene Squibb, MD Primary Cardiologist:  Rozann Lesches, MD  Patient Profile    ***  Past Medical History    Past Medical History:  Diagnosis Date   Coronary artery disease    a. s/p NSTEMI in 2014 with DES to D1 and DES to PL branch   Essential hypertension    Hyperlipidemia    Past Surgical History:  Procedure Laterality Date   CORONARY STENT PLACEMENT      Allergies  No Known Allergies  History of Present Illness    ***  Home Medications    Current Outpatient Medications  Medication Sig Dispense Refill   aspirin EC 81 MG tablet Take 1 tablet (81 mg total) by mouth daily. Swallow whole. 90 tablet 3   chlorthalidone (HYGROTON) 25 MG tablet TAKE 1 TABLET(25 MG) BY MOUTH DAILY 7 tablet 0   chlorthalidone (HYGROTON) 25 MG tablet Take 1 tablet (25 mg total) by mouth daily. 30 tablet 2   FLUoxetine (PROZAC) 20 MG capsule Take 1 capsule (20 mg total) by mouth daily. 30 capsule 3   labetalol (NORMODYNE) 200 MG tablet TAKE 1 TABLET(200 MG) BY MOUTH TWICE DAILY 30 tablet 0   losartan (COZAAR) 100 MG tablet Take 1 tablet by mouth daily 30 tablet 0   methocarbamol (ROBAXIN) 750 MG tablet Take 750-1,500 mg by mouth every 6 (six) hours as needed for muscle spasms.     rosuvastatin (CRESTOR) 20 MG tablet TAKE 1 TABLET(20 MG) BY MOUTH DAILY AT 6 PM 7 tablet 0   spironolactone (ALDACTONE) 50 MG tablet TAKE 1 TABLET(50 MG) BY MOUTH DAILY 30 tablet 0   albuterol (VENTOLIN HFA) 108 (90 Base) MCG/ACT inhaler Inhale 1-2 puffs into the lungs every 6 (six) hours as needed for wheezing or shortness of breath. (Patient not taking: Reported on 09/21/2022) 18 g 0   azithromycin (ZITHROMAX) 250 MG tablet Take first 2 tablets together, then 1 every day until finished. (Patient not taking: Reported on 09/21/2022) 6 tablet 0   nitroGLYCERIN (NITROSTAT) 0.4 MG SL tablet Place 0.4 mg  under the tongue every 5 (five) minutes as needed for chest pain.  (Patient not taking: Reported on 02/09/2022)     predniSONE (STERAPRED UNI-PAK 21 TAB) 10 MG (21) TBPK tablet 10 mg DS 12 as directed (Patient not taking: Reported on 09/21/2022) 48 tablet 0   promethazine-dextromethorphan (PROMETHAZINE-DM) 6.25-15 MG/5ML syrup Take 5 mLs by mouth 4 (four) times daily as needed. (Patient not taking: Reported on 09/21/2022) 100 mL 0   No current facility-administered medications for this visit.     Family History    Family History  Problem Relation Age of Onset   Diabetes Paternal Grandfather    Other Paternal Grandmother        low blood sugar   Other Maternal Grandmother        gallstones-septic   Other Maternal Grandfather        heart issues   Hypertension Father    Heart disease Father    Diabetes Father    Hypertension Mother    Cancer Mother        ovarian   Diabetes Brother        borderline   She indicated that her mother is alive. She indicated that her father is alive. She indicated that her sister is alive. She indicated that her brother is alive.  She indicated that her maternal grandmother is deceased. She indicated that her maternal grandfather is deceased. She indicated that her paternal grandmother is deceased. She indicated that her paternal grandfather is deceased.  Social History    Social History   Socioeconomic History   Marital status: Single    Spouse name: Not on file   Number of children: Not on file   Years of education: Not on file   Highest education level: Not on file  Occupational History   Not on file  Tobacco Use   Smoking status: Former    Packs/day: 0.75    Years: 15.00    Total pack years: 11.25    Types: Cigarettes   Smokeless tobacco: Never  Vaping Use   Vaping Use: Never used  Substance and Sexual Activity   Alcohol use: No   Drug use: No   Sexual activity: Not on file  Other Topics Concern   Not on file  Social History  Narrative   Not on file   Social Determinants of Health   Financial Resource Strain: Not on file  Food Insecurity: Not on file  Transportation Needs: Not on file  Physical Activity: Not on file  Stress: Not on file  Social Connections: Not on file  Intimate Partner Violence: Not on file     Review of Systems    General:  No chills, fever, night sweats or weight changes.  Cardiovascular:  No chest pain, dyspnea on exertion, edema, orthopnea, palpitations, paroxysmal nocturnal dyspnea. Dermatological: No rash, lesions/masses Respiratory: No cough, dyspnea Urologic: No hematuria, dysuria Abdominal:   No nausea, vomiting, diarrhea, bright red blood per rectum, melena, or hematemesis Neurologic:  No visual changes, wkns, changes in mental status. All other systems reviewed and are otherwise negative except as noted above.     Physical Exam    VS:  BP 138/80   Pulse 80   Ht 5' 4.5" (1.638 m)   Wt 279 lb 12.8 oz (126.9 kg)   BMI 47.29 kg/m  , BMI Body mass index is 47.29 kg/m.     GEN: Well nourished, well developed, in no acute distress. HEENT: normal. Neck: Supple, no JVD, carotid bruits, or masses. Cardiac: RRR, no murmurs, rubs, or gallops. No clubbing, cyanosis, edema.  Radials 2+/PT 2+ and equal bilaterally.  Respiratory:  Respirations regular and unlabored, clear to auscultation bilaterally. GI: Soft, nontender, nondistended, BS + x 4. MS: no deformity or atrophy. Skin: warm and dry, no rash. Neuro:  Strength and sensation are intact. Psych: Normal affect.  Accessory Clinical Findings    ECG personally reviewed by me today- *** - No acute changes  Lab Results  Component Value Date   WBC 11.7 (H) 02/28/2020   HGB 13.7 02/28/2020   HCT 44.9 02/28/2020   MCV 85.2 02/28/2020   PLT 329 02/28/2020   Lab Results  Component Value Date   CREATININE 0.84 02/28/2020   BUN 19 02/28/2020   NA 138 02/28/2020   K 3.6 02/28/2020   CL 105 02/28/2020   CO2 24  02/28/2020   Lab Results  Component Value Date   ALT 11 02/13/2020   AST 14 02/13/2020   ALKPHOS 93 02/13/2020   BILITOT 0.4 02/13/2020   Lab Results  Component Value Date   LDLDIRECT 156.0 02/13/2020    No results found for: "HGBA1C"  Assessment & Plan   1.  ***  Maimuna Leaman, NP 09/21/2022, 2:59 PM

## 2022-09-21 NOTE — Progress Notes (Signed)
Cardiology Clinic Note   Patient Name: Chelsea Petty Date of Encounter: 09/21/2022  Primary Care Provider:  Celene Squibb, MD Primary Cardiologist:  Rozann Lesches, MD  Patient Profile    37 year old female with past medical history of CAD, hypertension, hyperlipidemia, former smoker, obesity, and recent viral illness of COVID-19 and flu x 2, who is here today to follow-up on her coronary artery disease.  Past Medical History    Past Medical History:  Diagnosis Date   Coronary artery disease    a. s/p NSTEMI in 2014 with DES to D1 and DES to PL branch   Essential hypertension    Hyperlipidemia    Past Surgical History:  Procedure Laterality Date   CORONARY STENT PLACEMENT      Allergies  No Known Allergies  History of Present Illness    Chelsea Petty. Rosamond is a 37 year old female with a previously mentioned past medical history of coronary artery disease status post NSTEMI in 2014 with PCI/DES to D1 and PCI/DES to PL branch, hypertension, hyperlipidemia, former smoker, and obesity.  She was last seen in clinic 07/01/2020 where her symptoms had resolved and she had denied any chest pain or palpitations.  She reported having occasional orthopnea at night over the past few weeks and was sleeping with more pillows she had also had increased weight gain of 5 to 10 pounds by her home scales.  At that time she reported good compliance with her current medication regimen but her blood pressures remained elevated with systolic pressures of 818E to 170s when checked at home.  She was ordered to have labs in 2 weeks and to keep blood pressure log at home.  She returns clinic today stating that she has been doing fairly well.  She continues to work at [employer] unfortunately has had COVID once in the flu twice within the last several months.  She has been congratulated today as she stated that she had stopped smoking approximately 8 months ago.  She is under a little bit of stress  and concern for her health today as her sister just recently passed away from a myocardial infarction.  She has changed her diet and continues to lose weight.  She is also eliminated caffeine from her diet as well.  So she returns today and denies any chest pain, shortness of breath, dyspnea on exertion, peripheral edema.  Unfortunately she is suffering from palpitations over the last several weeks that is impeding her sleep.  She denies any recent hospitalizations or visits to the emergency department.  Home Medications    Current Outpatient Medications  Medication Sig Dispense Refill   aspirin EC 81 MG tablet Take 1 tablet (81 mg total) by mouth daily. Swallow whole. 90 tablet 3   chlorthalidone (HYGROTON) 25 MG tablet Take 1 tablet (25 mg total) by mouth daily. 30 tablet 2   FLUoxetine (PROZAC) 20 MG capsule Take 1 capsule (20 mg total) by mouth daily. 30 capsule 3   labetalol (NORMODYNE) 200 MG tablet TAKE 1 TABLET(200 MG) BY MOUTH TWICE DAILY 30 tablet 0   losartan (COZAAR) 100 MG tablet Take 1 tablet by mouth daily 30 tablet 0   methocarbamol (ROBAXIN) 750 MG tablet Take 750-1,500 mg by mouth every 6 (six) hours as needed for muscle spasms.     rosuvastatin (CRESTOR) 20 MG tablet TAKE 1 TABLET(20 MG) BY MOUTH DAILY AT 6 PM 7 tablet 0   spironolactone (ALDACTONE) 50 MG tablet TAKE 1 TABLET(50 MG)  BY MOUTH DAILY 30 tablet 0   albuterol (VENTOLIN HFA) 108 (90 Base) MCG/ACT inhaler Inhale 1-2 puffs into the lungs every 6 (six) hours as needed for wheezing or shortness of breath. (Patient not taking: Reported on 09/21/2022) 18 g 0   nitroGLYCERIN (NITROSTAT) 0.4 MG SL tablet Place 1 tablet (0.4 mg total) under the tongue every 5 (five) minutes as needed for chest pain. 25 tablet 3   No current facility-administered medications for this visit.     Family History    Family History  Problem Relation Age of Onset   Diabetes Paternal Grandfather    Other Paternal Grandmother        low blood  sugar   Other Maternal Grandmother        gallstones-septic   Other Maternal Grandfather        heart issues   Hypertension Father    Heart disease Father    Diabetes Father    Hypertension Mother    Cancer Mother        ovarian   Diabetes Brother        borderline   She indicated that her mother is alive. She indicated that her father is alive. She indicated that her sister is alive. She indicated that her brother is alive. She indicated that her maternal grandmother is deceased. She indicated that her maternal grandfather is deceased. She indicated that her paternal grandmother is deceased. She indicated that her paternal grandfather is deceased.  Social History    Social History   Socioeconomic History   Marital status: Single    Spouse name: Not on file   Number of children: Not on file   Years of education: Not on file   Highest education level: Not on file  Occupational History   Not on file  Tobacco Use   Smoking status: Former    Packs/day: 0.75    Years: 15.00    Total pack years: 11.25    Types: Cigarettes   Smokeless tobacco: Never  Vaping Use   Vaping Use: Never used  Substance and Sexual Activity   Alcohol use: No   Drug use: No   Sexual activity: Not on file  Other Topics Concern   Not on file  Social History Narrative   Not on file   Social Determinants of Health   Financial Resource Strain: Not on file  Food Insecurity: Not on file  Transportation Needs: Not on file  Physical Activity: Not on file  Stress: Not on file  Social Connections: Not on file  Intimate Partner Violence: Not on file     Review of Systems    General:  No chills, fever, night sweats or weight changes.  Cardiovascular:  No chest pain, dyspnea on exertion, edema, orthopnea, endorses palpitations, paroxysmal nocturnal dyspnea. Dermatological: No rash, lesions/masses Respiratory: No cough, dyspnea Urologic: No hematuria, dysuria Abdominal:   No nausea, vomiting,  diarrhea, bright red blood per rectum, melena, or hematemesis Neurologic:  No visual changes, wkns, changes in mental status. All other systems reviewed and are otherwise negative except as noted above.   Physical Exam    VS:  BP 138/80   Pulse 80   Ht 5' 4.5" (1.638 m)   Wt 279 lb 12.8 oz (126.9 kg)   BMI 47.29 kg/m  , BMI Body mass index is 47.29 kg/m.     GEN: Well nourished, well developed, in no acute distress. HEENT: normal. Neck: Supple, no JVD, carotid bruits, or masses. Cardiac:  RRR, no murmurs, rubs, or gallops. No clubbing, cyanosis, edema.  Radials 2+/PT 2+ and equal bilaterally.  Respiratory:  Respirations regular and unlabored, clear to auscultation bilaterally. GI: Soft, nontender, nondistended, BS + x 4. MS: no deformity or atrophy. Skin: warm and dry, no rash. Neuro:  Strength and sensation are intact. Psych: Normal affect.  Accessory Clinical Findings    ECG personally reviewed by me today-no new tracings completed today  Lab Results  Component Value Date   WBC 11.7 (H) 02/28/2020   HGB 13.7 02/28/2020   HCT 44.9 02/28/2020   MCV 85.2 02/28/2020   PLT 329 02/28/2020   Lab Results  Component Value Date   CREATININE 0.84 02/28/2020   BUN 19 02/28/2020   NA 138 02/28/2020   K 3.6 02/28/2020   CL 105 02/28/2020   CO2 24 02/28/2020   Lab Results  Component Value Date   ALT 11 02/13/2020   AST 14 02/13/2020   ALKPHOS 93 02/13/2020   BILITOT 0.4 02/13/2020   Lab Results  Component Value Date   LDLDIRECT 156.0 02/13/2020    No results found for: "HGBA1C"  Assessment & Plan   1. Palpitations the patient states have gradually worsened over the past month.  She has cut out nicotine and caffeine from her diet and has noticed the palpitations are hard beating heart beats have increased.  With the recent passing of her sister who just had a recent myocardial infarction she was concerned that her heart is not beating appropriately.  She is also not had  any recent blood work so we will do a CBC to rule out anemia as well as a BMP to check electrolytes and kidney function.  She is also being placed on a ZIO XT monitor for the next 2 weeks to evaluate any arrhythmia.  2.  Coronary artery disease of native heart of the native artery without angina.  She is status post NSTEMI from 2014 with successful PCI/DES to D1 and PL branch.  She denies any chest discomfort or anginal symptoms.  She is requesting a refill on Nitrostat today as she stated she has not had any.  She is continued on her current medication regimen of aspirin 81 mg daily, labetalol 200 mg twice daily and rosuvastatin 20 mg daily.   3.  Hypertension with a blood pressure today of 138/80 which is stable and better controlled than previous visits.  She is encouraged to continue to monitor her pressures at home.  She has been continued on chlorthalidone 25 mg daily, labetalol 200 mg twice daily, losartan 100 mg daily, and spironolactone 50 mg daily.  4.  Hyperlipidemia without any recent blood work done she has been continued on rosuvastatin 20 mg daily with an LDL goal of less than 70 been daily in the setting of known coronary artery disease.  Had any blood work done and has been sent for lipid panel.  5.  Disposition patient to return to clinic to see MD/APP in 2 months or sooner if needed to reevaluate symptoms and findings on ZIO XT monitor  Bronnie Vasseur, NP 09/21/2022, 3:39 PM

## 2022-09-22 ENCOUNTER — Telehealth: Payer: Self-pay

## 2022-09-22 DIAGNOSIS — Z79899 Other long term (current) drug therapy: Secondary | ICD-10-CM

## 2022-09-22 MED ORDER — ROSUVASTATIN CALCIUM 40 MG PO TABS: 40.0000 mg | ORAL_TABLET | Freq: Every day | ORAL | 3 refills | Status: AC

## 2022-09-22 NOTE — Progress Notes (Signed)
LDL (bad cholesterol) remains elevated at 160, goal is 70, increase crestor to 40 mg daily, avoid fried, fast, and fatty foods, repeat lipid and hepatic panel in 3 months, electrolytes are stable, kidney function is slightly increased make sure staying hydrated, blood counts remain normal

## 2022-09-22 NOTE — Telephone Encounter (Signed)
-----  Message from Gerrie Nordmann, NP sent at 09/22/2022 12:50 PM EST ----- LDL (bad cholesterol) remains elevated at 160, goal is 70, increase crestor to 40 mg daily, avoid fried, fast, and fatty foods, repeat lipid and hepatic panel in 3 months, electrolytes are stable, kidney function is slightly increased make sure stayi ng hydrated, blood counts remain normal

## 2022-09-22 NOTE — Telephone Encounter (Signed)
Results discussed with patient.She will increase Crestor to 40 mg qd and repeat lipid/lft's in 3 months,pcp copied

## 2022-09-25 ENCOUNTER — Other Ambulatory Visit: Payer: Self-pay | Admitting: Student

## 2022-09-25 ENCOUNTER — Other Ambulatory Visit: Payer: Self-pay | Admitting: Cardiology

## 2022-09-27 ENCOUNTER — Other Ambulatory Visit: Payer: Self-pay | Admitting: Family Medicine

## 2022-09-29 ENCOUNTER — Telehealth: Payer: Self-pay | Admitting: Cardiology

## 2022-09-29 MED ORDER — LABETALOL HCL 200 MG PO TABS: ORAL_TABLET | ORAL | 2 refills | Status: DC

## 2022-09-29 NOTE — Telephone Encounter (Signed)
*  STAT* If patient is at the pharmacy, call can be transferred to refill team.   1. Which medications need to be refilled? (please list name of each medication and dose if known) labetalol (NORMODYNE) 200 MG tablet   2. Which pharmacy/location (including street and city if local pharmacy) is medication to be sent to? [facility] [place], [place]    3. Do they need a 30 day or 90 day supply? [place]

## 2022-09-29 NOTE — Telephone Encounter (Signed)
Completed.

## 2022-10-20 ENCOUNTER — Encounter: Payer: Self-pay | Admitting: *Deleted

## 2022-10-20 ENCOUNTER — Telehealth: Payer: Self-pay | Admitting: *Deleted

## 2022-10-20 ENCOUNTER — Encounter: Payer: Self-pay | Admitting: Radiology

## 2022-10-20 DIAGNOSIS — R9431 Abnormal electrocardiogram [ECG] [EKG]: Secondary | ICD-10-CM

## 2022-10-20 NOTE — Progress Notes (Signed)
Recommend scheduling for Lexiscan stress test to rule out ischemic causes of abnormal findings on Zio monitor. Previously she had blood work done that rule out electrolyte abnormality.

## 2022-10-20 NOTE — Telephone Encounter (Signed)
Pt notified and order placed 

## 2022-10-20 NOTE — Telephone Encounter (Signed)
-----   Message from Gerrie Nordmann, NP sent at 10/20/2022 12:16 PM EST ----- Recommend scheduling for Lexiscan stress test to rule out ischemic causes of abnormal findings on Zio monitor. Previously she had blood work done that rule out electrolyte abnormality.

## 2022-10-29 DIAGNOSIS — R070 Pain in throat: Secondary | ICD-10-CM | POA: Diagnosis not present

## 2022-10-29 DIAGNOSIS — Z20822 Contact with and (suspected) exposure to covid-19: Secondary | ICD-10-CM | POA: Diagnosis not present

## 2022-10-29 DIAGNOSIS — J069 Acute upper respiratory infection, unspecified: Secondary | ICD-10-CM | POA: Diagnosis not present

## 2022-11-02 ENCOUNTER — Encounter: Payer: BC Managed Care – PPO | Admitting: Adult Health

## 2022-11-25 ENCOUNTER — Ambulatory Visit
Admission: EM | Admit: 2022-11-25 | Discharge: 2022-11-25 | Disposition: A | Discharge status: Home / Self Care | Payer: BC Managed Care – PPO | Attending: Physician Assistant | Admitting: Physician Assistant

## 2022-11-25 ENCOUNTER — Encounter: Payer: Self-pay | Admitting: Emergency Medicine

## 2022-11-25 DIAGNOSIS — I5033 Acute on chronic diastolic (congestive) heart failure: Secondary | ICD-10-CM | POA: Diagnosis not present

## 2022-11-25 DIAGNOSIS — E785 Hyperlipidemia, unspecified: Secondary | ICD-10-CM | POA: Diagnosis not present

## 2022-11-25 DIAGNOSIS — J069 Acute upper respiratory infection, unspecified: Secondary | ICD-10-CM

## 2022-11-25 DIAGNOSIS — Z1152 Encounter for screening for COVID-19: Secondary | ICD-10-CM | POA: Insufficient documentation

## 2022-11-25 DIAGNOSIS — R197 Diarrhea, unspecified: Secondary | ICD-10-CM | POA: Diagnosis not present

## 2022-11-25 DIAGNOSIS — Z87891 Personal history of nicotine dependence: Secondary | ICD-10-CM | POA: Insufficient documentation

## 2022-11-25 DIAGNOSIS — I110 Hypertensive heart disease with heart failure: Secondary | ICD-10-CM | POA: Diagnosis not present

## 2022-11-25 DIAGNOSIS — I252 Old myocardial infarction: Secondary | ICD-10-CM | POA: Diagnosis not present

## 2022-11-25 DIAGNOSIS — I2510 Atherosclerotic heart disease of native coronary artery without angina pectoris: Secondary | ICD-10-CM | POA: Insufficient documentation

## 2022-11-25 DIAGNOSIS — R058 Other specified cough: Secondary | ICD-10-CM | POA: Diagnosis not present

## 2022-11-25 DIAGNOSIS — R110 Nausea: Secondary | ICD-10-CM | POA: Insufficient documentation

## 2022-11-25 LAB — POCT INFLUENZA A/B
Influenza A, POC: NEGATIVE
Influenza B, POC: NEGATIVE

## 2022-11-25 MED ORDER — PROMETHAZINE-DM 6.25-15 MG/5ML PO SYRP: 5.0000 mL | ORAL_SOLUTION | Freq: Three times a day (TID) | ORAL | 0 refills | Status: DC | PRN

## 2022-11-25 NOTE — ED Provider Notes (Signed)
[facility]    CSN: [id number] Arrival date & time: 11/25/22  1131      History   Chief Complaint No chief complaint on file.   HPI Chelsea Petty is a 37 y.o. female.   Patient presents today with a 24-hour history of URI symptoms.  She reports headache, nausea, diarrhea, cough, congestion, hoarseness.  Denies any chest pain, shortness of breath, vomiting, significant abdominal pain.  She does report that her husband was sick last week with similar symptoms.  Denies additional known sick contacts.  She has had COVID in November 2023.  She has had COVID-19 vaccinations.  She is also up-to-date on her influenza immunization.  She does have a history of cardiovascular disease but is otherwise healthy and denies any history of asthma, allergies, COPD.  She quit smoking approximately 6 months ago.  She has tried over-the-counter medications such as Tylenol and Excedrin with only temporary relief of symptoms.  Denies any recent antibiotics or steroids.  She is confident that she is not pregnant.    Past Medical History:  Diagnosis Date   Coronary artery disease    a. s/p NSTEMI in 2014 with DES to D1 and DES to PL branch   Essential hypertension    Hyperlipidemia     Patient Active Problem List   Diagnosis Date Noted   Elevated troponin    Acute on chronic diastolic HF (heart failure)    Chest pain 02/24/2020   Hypertensive urgency 02/24/2020   Pulmonary edema 02/24/2020   Essential hypertension 07/28/2015   Amenorrhea 03/10/2015   Tobacco abuse 09/30/2013   CAD (coronary artery disease) 12/05/2012   Palpitations 12/05/2012   Malignant hypertension 11/12/2012   Obesity 11/12/2012    Past Surgical History:  Procedure Laterality Date   CORONARY STENT PLACEMENT      OB History     Gravida  0   Para  0   Term  0   Preterm  0   AB  0   Living  0      SAB  0   IAB  0   Ectopic  0   Multiple  0   Live Births  0            Home  Medications    Prior to Admission medications   Medication Sig Start Date End Date Taking? Authorizing Provider  promethazine-dextromethorphan (PROMETHAZINE-DM) 6.25-15 MG/5ML syrup Take 5 mLs by mouth 3 (three) times daily as needed for cough. 11/25/22  Yes Vernia Teem K, PA-C  albuterol (VENTOLIN HFA) 108 (90 Base) MCG/ACT inhaler Inhale 1-2 puffs into the lungs every 6 (six) hours as needed for wheezing or shortness of breath. Patient not taking: Reported on 09/21/2022 06/26/22   Particia NearingLane, Rachel Elizabeth, PA-C  aspirin EC 81 MG tablet Take 1 tablet (81 mg total) by mouth daily. Swallow whole. 03/25/20   Strader, Lennart PallBrittany M, PA-C  chlorthalidone (HYGROTON) 25 MG tablet TAKE 1 TABLET(25 MG) BY MOUTH DAILY 09/26/22   Jonelle SidleMcDowell, Samuel G, MD  FLUoxetine (PROZAC) 20 MG capsule Take 1 capsule (20 mg total) by mouth daily. 02/13/20   Rodolph Bongorey, Evan S, MD  labetalol (NORMODYNE) 200 MG tablet TAKE 1 TABLET(200 MG) BY MOUTH TWICE DAILY 09/29/22   Jonelle SidleMcDowell, Samuel G, MD  losartan (COZAAR) 100 MG tablet TAKE 1 TABLET BY MOUTH DAILY 09/26/22   Jonelle SidleMcDowell, Samuel G, MD  methocarbamol (ROBAXIN) 750 MG tablet Take 750-1,500 mg by mouth every 6 (six) hours as needed for  muscle spasms. 03/06/20   [provider]  nitroGLYCERIN (NITROSTAT) 0.4 MG SL tablet Place 1 tablet (0.4 mg total) under the tongue every 5 (five) minutes as needed for chest pain. 09/21/22   Charlsie Quest, NP  rosuvastatin (CRESTOR) 40 MG tablet Take 1 tablet (40 mg total) by mouth daily. 09/22/22 09/17/23  Charlsie Quest, NP  spironolactone (ALDACTONE) 50 MG tablet TAKE 1 TABLET(50 MG) BY MOUTH DAILY 09/26/22   Jonelle Sidle, MD    Family History Family History  Problem Relation Age of Onset   Diabetes Paternal Grandfather    Other Paternal Grandmother        low blood sugar   Other Maternal Grandmother        gallstones-septic   Other Maternal Grandfather        heart issues   Hypertension Father    Heart disease Father    Diabetes Father     Hypertension Mother    Cancer Mother        ovarian   Diabetes Brother        borderline    Social History Social History   Tobacco Use   Smoking status: Former    Packs/day: 0.75    Years: 15.00    Additional pack years: 0.00    Total pack years: 11.25    Types: Cigarettes   Smokeless tobacco: Never  Vaping Use   Vaping Use: Never used  Substance Use Topics   Alcohol use: No   Drug use: No     Allergies   Patient has no known allergies.   Review of Systems Review of Systems  Constitutional:  Positive for activity change. Negative for appetite change, fatigue and fever.  HENT:  Positive for congestion, postnasal drip and sore throat. Negative for sinus pressure and sneezing.   Respiratory:  Positive for cough. Negative for shortness of breath.   Cardiovascular:  Negative for chest pain.  Gastrointestinal:  Positive for diarrhea and nausea. Negative for abdominal pain and vomiting.  Neurological:  Positive for headaches. Negative for dizziness and light-headedness.     Physical Exam Triage Vital Signs ED Triage Vitals  Enc Vitals Group     BP 11/25/22 1154 116/79     Pulse Rate 11/25/22 1154 76     Resp 11/25/22 1154 18     Temp 11/25/22 1154 98.7 F (37.1 C)     Temp Source 11/25/22 1154 Oral     SpO2 11/25/22 1154 97 %     Weight --      Height --      Head Circumference --      Peak Flow --      Pain Score 11/25/22 1156 3     Pain Loc --      Pain Edu? --      Excl. in GC? --    No data found.  Updated Vital Signs BP 116/79 (BP Location: Right Arm)   Pulse 76   Temp 98.7 F (37.1 C) (Oral)   Resp 18   LMP 11/22/2022 (Exact Date)   SpO2 97%   Visual Acuity Right Eye Distance:   Left Eye Distance:   Bilateral Distance:    Right Eye Near:   Left Eye Near:    Bilateral Near:     Physical Exam Vitals reviewed.  Constitutional:      General: She is awake. She is not in acute distress.    Appearance: Normal appearance. She is  well-developed. She is  not ill-appearing.     Comments: Very pleasant female appears stated age in no acute distress sitting comfortably in exam room  HENT:     Head: Normocephalic and atraumatic.     Right Ear: External ear normal. There is impacted cerumen.     Left Ear: External ear normal. There is impacted cerumen.     Nose:     Right Sinus: No maxillary sinus tenderness or frontal sinus tenderness.     Left Sinus: No maxillary sinus tenderness or frontal sinus tenderness.     Mouth/Throat:     Pharynx: Uvula midline. No oropharyngeal exudate or posterior oropharyngeal erythema.  Cardiovascular:     Rate and Rhythm: Normal rate and regular rhythm.     Heart sounds: Normal heart sounds, S1 normal and S2 normal. No murmur heard. Pulmonary:     Effort: Pulmonary effort is normal.     Breath sounds: Normal breath sounds. No wheezing, rhonchi or rales.     Comments: Clear to auscultation bilaterally Abdominal:     General: Bowel sounds are normal.     Palpations: Abdomen is soft.     Tenderness: There is no abdominal tenderness. There is no right CVA tenderness, left CVA tenderness, guarding or rebound.     Comments: Benign abdominal exam  Psychiatric:        Behavior: Behavior is cooperative.      UC Treatments / Results  Labs (all labs ordered are listed, but only abnormal results are displayed) Labs Reviewed  SARS CORONAVIRUS 2 (TAT 6-24 HRS)  POCT INFLUENZA A/B    EKG   Radiology No results found.  Procedures Procedures (including critical care time)  Medications Ordered in UC Medications - No data to display  Initial Impression / Assessment and Plan / UC Course  I have reviewed the triage vital signs and the nursing notes.  Pertinent labs & imaging results that were available during my care of the patient were reviewed by me and considered in my medical decision making (see chart for details).     Patient is well-appearing, afebrile, nontoxic,  nontachycardic.  No evidence of acute infection on physical exam that warrant initiation of antibiotics.  Flu testing was negative in office.  COVID test is pending.  Patient does have a history of cardiovascular disease so is a candidate for Paxlovid if positive for COVID.  She has a metabolic panel on file from 09/21/2022 with creatinine of 1.13 and EGFR greater than 60.  If she takes Paxlovid she would need to hold her rosuvastatin while on the medication for 3 days after completing course.  She would also need to monitor her blood pressure closely given potential interactions with her blood pressure medication but does not require dose adjustment.  She was encouraged to use over-the-counter medications as needed.  She was prescribed with using DM for cough.  Discussed that this can be sedating and she is not to drive or drink alcohol with taking it.  She is to rest and drink plenty of fluids.  Discussed that if her symptoms do not improve within a week she is to return for reevaluation.  If she has any worsening or changing symptoms she needs to be seen immediately.  Strict return precautions given.  Work excuse note provided.  Final Clinical Impressions(s) / UC Diagnoses   Final diagnoses:  Viral URI with cough     Discharge Instructions      We will contact you if you are positive for COVID.  Please monitor your MyChart for these results.  If you are positive you are candidate for Paxlovid.  If you take Paxlovid you would need to hold your rosuvastatin while on the medication and for 3 days after completing course.  You would also need to monitor your blood pressure closely.  I have called in Promethazine DM for cough.  Please do not drive or drink alcohol with taking this medication as it can cause sedation.  Use over-the-counter medications including Mucinex, Tylenol, Flonase.  Make sure that you rest and drink plenty of fluid.  If your symptoms or not improving within a week please return for  reevaluation.  If anything worsens and you have chest pain, shortness of breath, high fever not responding to medication, weakness, worsening cough you should be seen immediately.     ED Prescriptions     Medication Sig Dispense Auth. Provider   promethazine-dextromethorphan (PROMETHAZINE-DM) 6.25-15 MG/5ML syrup Take 5 mLs by mouth 3 (three) times daily as needed for cough. 118 mL Jayron Maqueda K, PA-C      PDMP not reviewed this encounter.   Jeani Hawking, PA-C 11/25/22 1234

## 2022-11-25 NOTE — ED Triage Notes (Signed)
Diarrhea since Thursday.  Headache, nausea, acid reflux and cough.  Cough productive at times.  Scratchy throat this morning.

## 2022-11-25 NOTE — Discharge Instructions (Signed)
We will contact you if you are positive for COVID.  Please monitor your MyChart for these results.  If you are positive you are candidate for Paxlovid.  If you take Paxlovid you would need to hold your rosuvastatin while on the medication and for 3 days after completing course.  You would also need to monitor your blood pressure closely.  I have called in Promethazine DM for cough.  Please do not drive or drink alcohol with taking this medication as it can cause sedation.  Use over-the-counter medications including Mucinex, Tylenol, Flonase.  Make sure that you rest and drink plenty of fluid.  If your symptoms or not improving within a week please return for reevaluation.  If anything worsens and you have chest pain, shortness of breath, high fever not responding to medication, weakness, worsening cough you should be seen immediately.

## 2022-11-26 LAB — SARS CORONAVIRUS 2 (TAT 6-24 HRS): SARS Coronavirus 2: NEGATIVE

## 2022-11-30 ENCOUNTER — Ambulatory Visit: Payer: BC Managed Care – PPO | Attending: Student | Admitting: Student

## 2022-11-30 NOTE — Progress Notes (Deleted)
Cardiology Office Note    Date:  11/30/2022  ID:  Chelsea Petty, Chelsea Petty October 19, 1985, MRN [id number] Cardiologist: Chelsea Dell, MD    History of Present Illness:    Chelsea Petty is a 37 y.o. female with past medical history of CAD (s/p NSTEMI in 2014 with DES to D1 and DES to PL branch), HTN, HLD and obesity who presents to the office today for 85-month follow-up.  She was last examined by Chelsea Quest, NP in 08/2022 and reported having COVID and flu within the past few months. She had quit smoking approximately 8 months prior.  She denied any recent anginal symptoms but did report palpitations over the past few weeks.  A 2-week ZIO monitor was recommended for further assessment.  Her ZIO monitor showed predominantly normal sinus rhythm with a brief episode of second-degree type I winky block.  She did have rare PACs representing less than 1% of total beats and rare PACs representing less than 1% of total beats.  Did have 5 episodes of NSVT with the longest being 8.6 seconds.  It was recommended that she have a repeat Lexiscan Myoview to rule out ischemic causes of her NSVT.  By review of the chart, this was ordered but not yet scheduled.  Follow-up labs did show her LDL was elevated to 160 and Crestor was increased to 40 mg daily with plans for repeat labs in 3 months.  - Myoview  ROS: ***  Studies Reviewed:   EKG: EKG is*** ordered today and demonstrates ***  NST: 05/2015 Perfusion Analysis:   Resting SPECT images demonstrate homogeneous tracer distribution  throughout the myocardium.    Stress SPECT images demonstrate small perfusion abnormality of mild  intensity is present in the mid to peri-apical anterior wall.  Other walls  perfuse normally.     Event Monitor: 09/2022  ZIO monitor reviewed.  12 days, 13 hours analyzed.   Predominant rhythm is sinus with heart rate ranging from 53 bpm up to 147 bpm and average heart rate 86 bpm.  Brief episode of second-degree type  I Wenckebach conduction noted. There were rare PACs as well as atrial couplets and triplets representing less than 1% total beats. There were rare PVCs as well as ventricular couplets and triplets representing less than 1% total beats.  Also limited episodes of ventricular bigeminy and trigeminy. Five episodes of NSVT were noted, the longest of which lasted 8.6 seconds. There were 11 episodes of brief PSVT, the longest of which lasted 15 beats. No high degree heart block or pauses noted.  Risk Assessment/Calculations:   {Does this patient have ATRIAL FIBRILLATION?:[id number]} No BP recorded.  {Refresh Note OR Click here to enter BP  :1}***         Physical Exam:   VS:  LMP 11/22/2022 (Exact Date)    Wt Readings from Last 3 Encounters:  09/21/22 279 lb 12.8 oz (126.9 kg)  02/09/22 284 lb 6.4 oz (129 kg)  07/01/20 279 lb 6.4 oz (126.7 kg)     GEN: Well nourished, well developed in no acute distress NECK: No JVD; No carotid bruits CARDIAC: ***RRR, no murmurs, rubs, gallops RESPIRATORY:  Clear to auscultation without rales, wheezing or rhonchi  ABDOMEN: Appears non-distended. No obvious abdominal masses. EXTREMITIES: No clubbing or cyanosis. No edema.  Distal pedal pulses are 2+ bilaterally.   Assessment and Plan:   1. CAD - She is s/p NSTEMI in 2014 with DES to D1 and DES to PL branch. ***  2.  Palpitations - Recent monitor showed rare PACs and PVCs representing less than 1% of total beats but she did have 5 episodes of NSVT with the longest being 8.6 seconds.  ***  3. HTN - ***  4. HLD - FLP in 08/2022 showed total cholesterol 242, triglycerides 208, HDL 40 and LDL 160.  Crestor was titrated from 20 mg daily to 40 mg daily at that time. ***  Signed, Chelsea Lennox, PA-C

## 2022-12-02 ENCOUNTER — Encounter: Payer: Self-pay | Admitting: Student

## 2022-12-27 DIAGNOSIS — M545 Low back pain, unspecified: Secondary | ICD-10-CM | POA: Diagnosis not present

## 2023-01-04 DIAGNOSIS — F331 Major depressive disorder, recurrent, moderate: Secondary | ICD-10-CM | POA: Diagnosis not present

## 2023-01-04 DIAGNOSIS — E782 Mixed hyperlipidemia: Secondary | ICD-10-CM | POA: Diagnosis not present

## 2023-01-04 DIAGNOSIS — I10 Essential (primary) hypertension: Secondary | ICD-10-CM | POA: Diagnosis not present

## 2023-01-04 DIAGNOSIS — E1165 Type 2 diabetes mellitus with hyperglycemia: Secondary | ICD-10-CM | POA: Diagnosis not present

## 2023-01-11 DIAGNOSIS — E1165 Type 2 diabetes mellitus with hyperglycemia: Secondary | ICD-10-CM | POA: Diagnosis not present

## 2023-01-13 ENCOUNTER — Ambulatory Visit
Admission: EM | Admit: 2023-01-13 | Discharge: 2023-01-13 | Disposition: A | Discharge status: Home / Self Care | Payer: BC Managed Care – PPO

## 2023-01-13 ENCOUNTER — Other Ambulatory Visit: Payer: Self-pay

## 2023-01-13 ENCOUNTER — Encounter: Payer: Self-pay | Admitting: Emergency Medicine

## 2023-01-13 DIAGNOSIS — S0501XA Injury of conjunctiva and corneal abrasion without foreign body, right eye, initial encounter: Secondary | ICD-10-CM

## 2023-01-13 HISTORY — DX: Type 2 diabetes mellitus without complications: E11.9

## 2023-01-13 MED ORDER — ERYTHROMYCIN 5 MG/GM OP OINT: TOPICAL_OINTMENT | Freq: Four times a day (QID) | OPHTHALMIC | 0 refills | Status: DC

## 2023-01-13 NOTE — Discharge Instructions (Signed)
You have a corneal abrasion on your right eye.  Please use the erythromycin ointment as prescribed to treat it.  Follow-up with ophthalmologist if symptoms persist or worsen despite treatment.

## 2023-01-13 NOTE — ED Triage Notes (Signed)
Pt reports "scratched" right eye last night while trying to put eye lashes on. Pt reports pain, watery drainage, redness ever since.

## 2023-01-13 NOTE — ED Provider Notes (Signed)
[facility]    CSN: [id number] Arrival date & time: 01/13/23  1426      History   Chief Complaint Chief Complaint  Patient presents with   Eye Problem    HPI Chelsea Petty is a 37 y.o. female.   Patient presents today with right eye irritation.  Reports she was wearing fake eyelashes yesterday and thinks the glue may have gotten caught in her eye and she rubbed her eye.  She is now describes foreign body sensation to the lateral aspect of the right eye.  Reports her eye has been tearing a lot today and she has had some photosensitivity.  No headache or floaters in the vision, double vision or blurred vision.  No recent cough, congestion, or sore throat.  Patient denies use of contacts.  She tried washing her eyes out with water last night without improvement in symptoms.    Past Medical History:  Diagnosis Date   Coronary artery disease    a. s/p NSTEMI in 2014 with DES to D1 and DES to PL branch   Diabetes mellitus without complication (HCC)    Essential hypertension    Hyperlipidemia     Patient Active Problem List   Diagnosis Date Noted   Elevated troponin    Acute on chronic diastolic HF (heart failure) (HCC)    Chest pain 02/24/2020   Hypertensive urgency 02/24/2020   Pulmonary edema 02/24/2020   Essential hypertension 07/28/2015   Amenorrhea 03/10/2015   Tobacco abuse 09/30/2013   CAD (coronary artery disease) 12/05/2012   Palpitations 12/05/2012   Malignant hypertension 11/12/2012   Obesity 11/12/2012    Past Surgical History:  Procedure Laterality Date   CORONARY STENT PLACEMENT      OB History     Gravida  0   Para  0   Term  0   Preterm  0   AB  0   Living  0      SAB  0   IAB  0   Ectopic  0   Multiple  0   Live Births  0            Home Medications    Prior to Admission medications   Medication Sig Start Date End Date Taking? Authorizing Provider  erythromycin ophthalmic ointment Place into the  right eye 4 (four) times daily. Place a 1/2 inch ribbon of ointment into the lower eyelid. 01/13/23  Yes Valentino Nose, NP  metformin (FORTAMET) 500 MG (OSM) 24 hr tablet Take 500 mg by mouth daily with breakfast.   Yes [provider]  albuterol (VENTOLIN HFA) 108 (90 Base) MCG/ACT inhaler Inhale 1-2 puffs into the lungs every 6 (six) hours as needed for wheezing or shortness of breath. Patient not taking: Reported on 09/21/2022 06/26/22   Particia Nearing, PA-C  aspirin EC 81 MG tablet Take 1 tablet (81 mg total) by mouth daily. Swallow whole. 03/25/20   Strader, Lennart Pall, PA-C  chlorthalidone (HYGROTON) 25 MG tablet TAKE 1 TABLET(25 MG) BY MOUTH DAILY 09/26/22   Jonelle Sidle, MD  FLUoxetine (PROZAC) 20 MG capsule Take 1 capsule (20 mg total) by mouth daily. 02/13/20   Rodolph Bong, MD  labetalol (NORMODYNE) 200 MG tablet TAKE 1 TABLET(200 MG) BY MOUTH TWICE DAILY 09/29/22   Jonelle Sidle, MD  losartan (COZAAR) 100 MG tablet TAKE 1 TABLET BY MOUTH DAILY 09/26/22   Jonelle Sidle, MD  methocarbamol (ROBAXIN) 750 MG tablet  Take 750-1,500 mg by mouth every 6 (six) hours as needed for muscle spasms. 03/06/20   [provider]  nitroGLYCERIN (NITROSTAT) 0.4 MG SL tablet Place 1 tablet (0.4 mg total) under the tongue every 5 (five) minutes as needed for chest pain. 09/21/22   Charlsie Quest, NP  promethazine-dextromethorphan (PROMETHAZINE-DM) 6.25-15 MG/5ML syrup Take 5 mLs by mouth 3 (three) times daily as needed for cough. 11/25/22   Raspet, Noberto Retort, PA-C  rosuvastatin (CRESTOR) 40 MG tablet Take 1 tablet (40 mg total) by mouth daily. 09/22/22 09/17/23  Charlsie Quest, NP  spironolactone (ALDACTONE) 50 MG tablet TAKE 1 TABLET(50 MG) BY MOUTH DAILY 09/26/22   Jonelle Sidle, MD    Family History Family History  Problem Relation Age of Onset   Diabetes Paternal Grandfather    Other Paternal Grandmother        low blood sugar   Other Maternal Grandmother         gallstones-septic   Other Maternal Grandfather        heart issues   Hypertension Father    Heart disease Father    Diabetes Father    Hypertension Mother    Cancer Mother        ovarian   Diabetes Brother        borderline    Social History Social History   Tobacco Use   Smoking status: Former    Packs/day: 0.75    Years: 15.00    Additional pack years: 0.00    Total pack years: 11.25    Types: Cigarettes   Smokeless tobacco: Never  Vaping Use   Vaping Use: Never used  Substance Use Topics   Alcohol use: No   Drug use: No     Allergies   Patient has no known allergies.   Review of Systems Review of Systems Per HPI  Physical Exam Triage Vital Signs ED Triage Vitals  Enc Vitals Group     BP 01/13/23 1454 128/88     Pulse Rate 01/13/23 1454 79     Resp 01/13/23 1454 20     Temp 01/13/23 1454 98.5 F (36.9 C)     Temp Source 01/13/23 1454 Oral     SpO2 01/13/23 1454 94 %     Weight --      Height --      Head Circumference --      Peak Flow --      Pain Score 01/13/23 1452 4     Pain Loc --      Pain Edu? --      Excl. in GC? --    No data found.  Updated Vital Signs BP 128/88 (BP Location: Right Arm)   Pulse 79   Temp 98.5 F (36.9 C) (Oral)   Resp 20   LMP 12/25/2022 (Approximate)   SpO2 94%   Visual Acuity Right Eye Distance:   Left Eye Distance:   Bilateral Distance:    Right Eye Near:   Left Eye Near:    Bilateral Near:     Physical Exam Vitals and nursing note reviewed.  Constitutional:      General: She is not in acute distress.    Appearance: Normal appearance. She is not toxic-appearing.  HENT:     Right Ear: External ear normal.     Left Ear: External ear normal.     Nose: Nose normal. No congestion or rhinorrhea.     Mouth/Throat:     Mouth: Mucous  membranes are moist.     Pharynx: Oropharynx is clear.  Eyes:     General:        Right eye: No discharge.        Left eye: No discharge.     Extraocular Movements:  Extraocular movements intact.     Pupils: Pupils are equal, round, and reactive to light.     Right eye: Fluorescein uptake present.  Pulmonary:     Effort: Pulmonary effort is normal. No respiratory distress.  Skin:    General: Skin is warm and dry.     Coloration: Skin is not jaundiced or pale.     Findings: No erythema.  Neurological:     Mental Status: She is alert and oriented to person, place, and time.  Psychiatric:        Behavior: Behavior is cooperative.      UC Treatments / Results  Labs (all labs ordered are listed, but only abnormal results are displayed) Labs Reviewed - No data to display  EKG   Radiology No results found.  Procedures Procedures (including critical care time)  Medications Ordered in UC Medications - No data to display  Initial Impression / Assessment and Plan / UC Course  I have reviewed the triage vital signs and the nursing notes.  Pertinent labs & imaging results that were available during my care of the patient were reviewed by me and considered in my medical decision making (see chart for details).   Patient is well-appearing, normotensive, afebrile, not tachycardic, not tachypneic, oxygenating well on room air.    1. Abrasion of right cornea, initial encounter Treat with erythromycin ointment 4 times daily for 5 days Supportive care discussed with patient Follow-up with ophthalmology if no improvement or worsening of symptoms despite treatment  The patient was given the opportunity to ask questions.  All questions answered to their satisfaction.  The patient is in agreement to this plan.    Final Clinical Impressions(s) / UC Diagnoses   Final diagnoses:  Abrasion of right cornea, initial encounter     Discharge Instructions      You have a corneal abrasion on your right eye.  Please use the erythromycin ointment as prescribed to treat it.  Follow-up with ophthalmologist if symptoms persist or worsen despite  treatment.    ED Prescriptions     Medication Sig Dispense Auth. Provider   erythromycin ophthalmic ointment Place into the right eye 4 (four) times daily. Place a 1/2 inch ribbon of ointment into the lower eyelid. 3.5 g Valentino Nose, NP      PDMP not reviewed this encounter.   Valentino Nose, NP 01/13/23 1610

## 2023-01-20 ENCOUNTER — Encounter (HOSPITAL_COMMUNITY): Payer: BC Managed Care – PPO

## 2023-02-22 ENCOUNTER — Other Ambulatory Visit: Payer: Self-pay | Admitting: Cardiology

## 2023-03-08 DIAGNOSIS — H6123 Impacted cerumen, bilateral: Secondary | ICD-10-CM | POA: Diagnosis not present

## 2023-03-08 DIAGNOSIS — J019 Acute sinusitis, unspecified: Secondary | ICD-10-CM | POA: Diagnosis not present

## 2023-03-08 DIAGNOSIS — R059 Cough, unspecified: Secondary | ICD-10-CM | POA: Diagnosis not present

## 2023-03-15 ENCOUNTER — Ambulatory Visit: Payer: BC Managed Care – PPO | Admitting: Skilled Nursing Facility1

## 2023-04-13 DIAGNOSIS — E782 Mixed hyperlipidemia: Secondary | ICD-10-CM | POA: Diagnosis not present

## 2023-05-03 DIAGNOSIS — I10 Essential (primary) hypertension: Secondary | ICD-10-CM | POA: Diagnosis not present

## 2023-05-03 DIAGNOSIS — F331 Major depressive disorder, recurrent, moderate: Secondary | ICD-10-CM | POA: Diagnosis not present

## 2023-05-03 DIAGNOSIS — H6123 Impacted cerumen, bilateral: Secondary | ICD-10-CM | POA: Diagnosis not present

## 2023-05-03 DIAGNOSIS — E782 Mixed hyperlipidemia: Secondary | ICD-10-CM | POA: Diagnosis not present

## 2023-05-14 ENCOUNTER — Other Ambulatory Visit: Payer: Self-pay | Admitting: Cardiology

## 2023-07-13 ENCOUNTER — Other Ambulatory Visit: Payer: Self-pay | Admitting: Cardiology

## 2023-07-25 DIAGNOSIS — R197 Diarrhea, unspecified: Secondary | ICD-10-CM | POA: Diagnosis not present

## 2023-07-25 DIAGNOSIS — R112 Nausea with vomiting, unspecified: Secondary | ICD-10-CM | POA: Diagnosis not present

## 2023-07-30 ENCOUNTER — Other Ambulatory Visit: Payer: Self-pay | Admitting: Cardiology

## 2023-08-07 ENCOUNTER — Telehealth: Payer: Self-pay | Admitting: *Deleted

## 2023-08-07 NOTE — Telephone Encounter (Signed)
   Pre-operative Risk Assessment    Patient Name: Chelsea Petty  DOB: Sep 14, 1985 MRN: [id number]  DATE OF LAST VISIT: 09/21/22 SHERI HAMMOCK, NP DATE OF NEXT VISIT: NONE   Request for Surgical Clearance    Procedure:   DEEP DENTAL CLEANING AT THIS TIME  Date of Surgery:  Clearance TBD                                 Surgeon:  NOT LISTED Surgeon's Group or Practice Name:  [facility] Phone number:  [phone]; JENNIFER Fax number:  [phone]   Type of Clearance Requested:   - Medical  - Pharmacy:  Hold Aspirin ; ALSO DOES PT NEED SBE/ABX   Type of Anesthesia:  Not Indicated   Additional requests/questions:    Elpidio Anis   08/07/2023, 2:29 PM

## 2023-08-07 NOTE — Telephone Encounter (Signed)
Will send a message to [place] office to see if they can help find an appt for preop clearance for the pt.

## 2023-08-07 NOTE — Telephone Encounter (Signed)
   Name: Chelsea Petty  DOB: 07-Dec-1985  MRN: [id number]  Primary Cardiologist: Nona Dell, MD  Chart reviewed as part of pre-operative protocol coverage. Because of EAVAN CHADWICK past medical history and time since last visit, she will require a follow-up in-office visit in order to better assess preoperative cardiovascular risk. Pt is overdue for follow-up (follows at [facility] office).   Pre-op covering staff: - Please schedule appointment and call patient to inform them. If patient already had an upcoming appointment within acceptable timeframe, please add "pre-op clearance" to the appointment notes so provider is aware. - Please contact requesting surgeon's office via preferred method (i.e, phone, fax) to inform them of need for appointment prior to surgery.    Joylene Grapes, NP  08/07/2023, 4:11 PM

## 2023-08-07 NOTE — Telephone Encounter (Signed)
[place], Malanie C  [place], [place] I had to lvm     [place], [place], [place] Hey if she'll go to [place] she can see peck on 1/9

## 2023-08-08 ENCOUNTER — Telehealth: Payer: Self-pay | Admitting: Student

## 2023-08-08 NOTE — Telephone Encounter (Signed)
    I last saw the patient in 06/2020 and she was on Spironolactone and Chlorthalidone at that time. Appears she saw Frutoso Schatz, NP in 08/2022 and was still on Chlorthalidone 25 mg daily and Spironolactone 50 mg daily. Labs at that time showed her creatinine was mildly elevated at 1.13. Recent labs on 07/26/2023 showed her creatinine was at 1.07 which has actually improved as compared to prior values. Would avoid NSAIDS. If Dr. Margo Aye wants to change her medications in the interim, that is up to him. We can reassess her volume status at her upcoming appointment in 09/2023 or add to the wait list for sooner evaluation if having issues but her lab work is similar to prior results.  Signed, Ellsworth Lennox, PA-C 08/08/2023, 9:40 AM

## 2023-08-08 NOTE — Telephone Encounter (Signed)
Patient would like a call back from nurse to discuss her fluid medication

## 2023-08-08 NOTE — Telephone Encounter (Signed)
Spoke to pt who stated that Dr. Margo Aye completed lab work for her and showed that her kidney function has decreased. Pt stated Dr. Marcelo Baldy office asked pt to call [facility] and discuss a reduction in fluid medications, however pt stated that she does not want to come off of medication because of swelling.   Lab work available for review in [facility].

## 2023-08-08 NOTE — Telephone Encounter (Signed)
Patient notified and verbalized understanding. Patient had no further questions or concerns at this time. Pt stated she was not having issues at this time.

## 2023-08-08 NOTE — Telephone Encounter (Signed)
 Left a message for patient to call office back regarding medication changes.

## 2023-08-08 NOTE — Telephone Encounter (Signed)
I will update all parties pt has appt 09/27/23 with Randall An, PAC. I will add need preop clearance to appt notes as well.

## 2023-08-20 ENCOUNTER — Other Ambulatory Visit: Payer: Self-pay | Admitting: Cardiology

## 2023-08-30 DIAGNOSIS — E782 Mixed hyperlipidemia: Secondary | ICD-10-CM | POA: Diagnosis not present

## 2023-08-30 DIAGNOSIS — H5789 Other specified disorders of eye and adnexa: Secondary | ICD-10-CM | POA: Diagnosis not present

## 2023-09-07 ENCOUNTER — Other Ambulatory Visit: Payer: Self-pay | Admitting: Cardiology

## 2023-09-27 ENCOUNTER — Ambulatory Visit: Payer: BC Managed Care – PPO | Attending: Student | Admitting: Student

## 2023-09-27 NOTE — Progress Notes (Deleted)
 Cardiology Office Note    Date:  09/27/2023  ID:  Chelsea, Petty 1986-01-31, MRN [id number] Cardiologist: Jayson Sierras, MD    History of Present Illness:    Chelsea Petty is a 38 y.o. female with past medical history of CAD (s/p NSTEMI in 2014 with DES to D1 and DES to PL branch), HTN, HLD and obesity who presents to the office today for overdue follow-up.   She was last examined by Tylene Lunch, NP in 08/2022 and reported having COVID and flu within the past few months. She had quit smoking approximately 8 months prior. She denied any recent anginal symptoms but did report palpitations over the past few weeks. A 2-week ZIO monitor was recommended for further assessment. Her ZIO monitor showed predominantly normal sinus rhythm with a brief episode of Wenckebach. She did have rare PAC's representing less than 1% of total beats and rare PVC's representing less than 1% of total beats. Did have 5 episodes of NSVT with the longest being 8.6 seconds. It was recommended that she have a repeat Lexiscan Myoview to rule out ischemic causes of her NSVT. By review of the chart, this was ordered but not yet scheduled. Follow-up labs did show her LDL was elevated to 160 and Crestor  was increased to 40 mg daily with plans for repeat labs in 3 months.   The office did receive a cardiac clearance request for deep dental cleaning in 07/2023 and was recommended to address at follow-up given the timeframe since last evaluation.  - Myoview - taking statin?  ROS: ***  Studies Reviewed:   EKG: EKG is*** ordered today and demonstrates ***   EKG Interpretation Date/Time:    Ventricular Rate:    PR Interval:    QRS Duration:    QT Interval:    QTC Calculation:   R Axis:      Text Interpretation:         Echocardiogram: 02/2020 IMPRESSIONS     1. Left ventricular ejection fraction, by estimation, is 55 to 60%. The  left ventricle has normal function. The left ventricle has no  regional  wall motion abnormalities. There is moderate left ventricular hypertrophy.  Left ventricular diastolic  parameters are consistent with Grade III diastolic dysfunction  (restrictive).   2. Right ventricular systolic function is normal. The right ventricular  size is normal. Tricuspid regurgitation signal is inadequate for assessing  PA pressure.   3. Left atrial size was moderately dilated.   4. The mitral valve is grossly normal. Mild mitral valve regurgitation.   5. The aortic valve was not well visualized, mild annular calcification.  Aortic valve regurgitation is not visualized.   6. The inferior vena cava is dilated in size with >50% respiratory  variability, suggesting right atrial pressure of 8 mmHg.   Event Monitor: 09/2022 ZIO monitor reviewed.  12 days, 13 hours analyzed.   Predominant rhythm is sinus with heart rate ranging from 53 bpm up to 147 bpm and average heart rate 86 bpm.  Brief episode of second-degree type I Wenckebach conduction noted. There were rare PACs as well as atrial couplets and triplets representing less than 1% total beats. There were rare PVCs as well as ventricular couplets and triplets representing less than 1% total beats.  Also limited episodes of ventricular bigeminy and trigeminy. Five episodes of NSVT were noted, the longest of which lasted 8.6 seconds. There were 11 episodes of brief PSVT, the longest of which lasted 15 beats. No high  degree heart block or pauses noted.  Risk Assessment/Calculations:   {Does this patient have ATRIAL FIBRILLATION?:[phone]} No BP recorded.  {Refresh Note OR Click here to enter BP  :1}***         Physical Exam:   VS:  There were no vitals taken for this visit.   Wt Readings from Last 3 Encounters:  09/21/22 279 lb 12.8 oz (126.9 kg)  02/09/22 284 lb 6.4 oz (129 kg)  07/01/20 279 lb 6.4 oz (126.7 kg)     GEN: Well nourished, well developed in no acute distress NECK: No JVD; No carotid  bruits CARDIAC: ***RRR, no murmurs, rubs, gallops RESPIRATORY:  Clear to auscultation without rales, wheezing or rhonchi  ABDOMEN: Appears non-distended. No obvious abdominal masses. EXTREMITIES: No clubbing or cyanosis. No edema.  Distal pedal pulses are 2+ bilaterally.   Assessment and Plan:   1. CAD - She is s/p NSTEMI in 2014 with DES to D1 and DES to PL branch. ***   2. Palpitations - Recent monitor showed rare PAC's and PVC's representing less than 1% of total beats but she did have 5 episodes of NSVT with the longest being 8.6 seconds.  ***   3. HTN - ***   4. HLD - FLP in 08/2022 showed total cholesterol 242, triglycerides 208, HDL 40 and LDL 160.  Crestor  was titrated from 20 mg daily to 40 mg daily at that time.  By review of [facility], she did have a repeat FLP with her PCP in 08/2023 which showed a total cholesterol was at 239 and LDL was significantly elevated at 186.   Signed, Laymon CHRISTELLA Qua, PA-C

## 2023-10-03 ENCOUNTER — Other Ambulatory Visit: Payer: Self-pay | Admitting: Cardiology

## 2023-10-14 ENCOUNTER — Other Ambulatory Visit: Payer: Self-pay | Admitting: Cardiology

## 2023-10-24 ENCOUNTER — Other Ambulatory Visit: Payer: Self-pay | Admitting: Cardiology

## 2023-11-01 ENCOUNTER — Ambulatory Visit: Payer: BC Managed Care – PPO | Admitting: Nurse Practitioner

## 2023-11-11 ENCOUNTER — Other Ambulatory Visit: Payer: Self-pay | Admitting: Cardiology

## 2023-11-13 ENCOUNTER — Telehealth: Payer: Self-pay

## 2023-11-13 NOTE — Telephone Encounter (Signed)
 Pt has appt 11/15/23 with Robin Searing, NP.

## 2023-11-13 NOTE — Telephone Encounter (Signed)
   Pre-operative Risk Assessment    Patient Name: Chelsea Petty  DOB: 1986-07-20 MRN: [id number]   Date of last office visit: 09/21/22 SHERI HAMMOCK, NP Date of next office visit: 11/15/23 ERNEST H. Napoleon Form., NP  Request for Surgical Clearance    Procedure:   DENTAL CLEANING  Date of Surgery:  Clearance TBD                                Surgeon:  NOT INDICATED Surgeon's Group or Practice Name:  [facility] Phone number:  [phone] Fax number:  [phone]   Type of Clearance Requested:   - Medical  - Pharmacy:  Hold Aspirin     Type of Anesthesia:  Not Indicated   Additional requests/questions:    SignedMarlow Baars   11/13/2023, 11:46 AM

## 2023-11-13 NOTE — Telephone Encounter (Signed)
 Primary Cardiologist:Samuel Diona Browner, MD  Chart reviewed as part of pre-operative protocol coverage. Because of Chelsea Petty past medical history and time since last visit, he/she will require a follow-up visit in order to better assess preoperative cardiovascular risk.  Pre-op covering staff: - Pt has appointment on 11/15/23 with Robin Searing, NP at which time clearance will be addressed.  - Please contact requesting surgeon's office via preferred method (i.e, phone, fax) to inform them of need for appointment prior to surgery.  Ideally aspirin should be continued without interruption, however if the bleeding risk is too great, aspirin may be held for 5-7 days prior to surgery as long as pt is not having any concernig cardiac symptoms at the time of the visit. Please resume aspirin post operatively when it is felt to be safe from a bleeding standpoint.   Levi Aland, NP-C  11/13/2023, 12:56 PM [address] Office [phone] Fax [phone]

## 2023-11-14 NOTE — Progress Notes (Deleted)
 Cardiology Office Note    Patient Name: Chelsea Petty Date of Encounter: 11/14/2023  Primary Care Provider:  Omie Bickers, MD Primary Cardiologist:  Teddie Favre, MD Primary Electrophysiologist: None   Past Medical History    Past Medical History:  Diagnosis Date   Coronary artery disease    a. s/p NSTEMI in 24-Dec-2012 with DES to D1 and DES to PL branch   Diabetes mellitus without complication (HCC)    Essential hypertension    Hyperlipidemia     History of Present Illness  Chelsea Petty is a 38 y.o. female with a PMH of CAD (s/p NSTEMI in 12/24/12 with DES to D1 and DES to PL branch), HTN, HLD and obesity who presents today for overdue follow-up.  Chelsea Petty  was initially seen in 2019/12/25 during hospitalization for chest pain.  She is currently followed by Dr. Londa Rival and was last seen by Woodfin Hays on 03/2020.  She was previously followed by [facility] cardiology since 12/24/16.  During her hospitalization for chest pain patient's blood pressure was elevated at 200/133.  Patient had self discontinued her medications due to depression suffered after her husband died in MVA in Dec 24, 2016.  She reported similar chest pain that occurred with her NSTEMI in 12-24-12 and patient's initial troponins were elevated with a BNP of 334.  Chest x-ray was completed that showed pulmonary edema and EKG was completed without ST abnormalities.  She was started on IV Lasix  labetalol  300 mg 3 times daily.  2D echo was completed showing EF of 55-60%, no RWMA, moderate LVH, grade 3 DD with normal RV systolic function and moderately dilated LA.  She was seen in follow-up on 03/2020 following her hospitalization patient denied any exertional chest pain or shortness of breath.  She did endorse some lower extremity edema after returning to work at [employer].  She was restarted on ASA 81 mg with no adjustment to blood pressure medications during visit.  She was last seen 06/2020 for 19-month follow-up.  She was still having  elevated blood pressures in the 150s to 170s at home.  Chlorthalidone  25 mg was started to her regimen with plan to follow-up for titration in 2 weeks.    She was last seen by Corinthia Dickinson, NP on 09/21/2022.  During her visit she was under increased stress due to the loss of her sister to a recent MI.  She reported also suffering from palpitations that cause difficulty sleeping.  She was noted to have stable blood pressure and reported cutting out caffeine and nicotine  in her diet.  She wore a 2-week Zio patch that revealed sinus rhythm with rare PACs and brief episode of second-degree type I Wenckebach and 5 episodes of NSVT with 11 episodes of brief PSVT lasting 15 beats.  She was recommended to undergo a Lexiscan Myoview for further evaluation but did not complete her test.  Patient denies chest pain, palpitations, dyspnea, PND, orthopnea, nausea, vomiting, dizziness, syncope, edema, weight gain, or early satiety.   Discussed the use of AI scribe software for clinical note transcription with the patient, who gave verbal consent to proceed.  History of Present Illness    ***Notes: aspirin  may be held for 5-7 days prior to surgery as long as pt is not having any concernig cardiac symptoms  -Lexiscan Myoview prior to clearance being granted. -Last ischemic evaluation: Patient is scheduled for dental cleaning  Review of Systems  Please see the history of present illness.    All other  systems reviewed and are otherwise negative except as noted above.  Physical Exam    Wt Readings from Last 3 Encounters:  09/21/22 279 lb 12.8 oz (126.9 kg)  02/09/22 284 lb 6.4 oz (129 kg)  07/01/20 279 lb 6.4 oz (126.7 kg)   JY:NWGNF were no vitals filed for this visit.,There is no height or weight on file to calculate BMI. GEN: Well nourished, well developed in no acute distress Neck: No JVD; No carotid bruits Pulmonary: Clear to auscultation without rales, wheezing or rhonchi  Cardiovascular: Normal  rate. Regular rhythm. Normal S1. Normal S2.   Murmurs: There is no murmur.  ABDOMEN: Soft, non-tender, non-distended EXTREMITIES:  No edema; No deformity   EKG/LABS/ Recent Cardiac Studies   ECG personally reviewed by me today - ***  Risk Assessment/Calculations:   {Does this patient have ATRIAL FIBRILLATION?:[id number]}      Lab Results  Component Value Date   WBC 11.3 (H) 09/21/2022   HGB 14.8 09/21/2022   HCT 45.0 09/21/2022   MCV 85.6 09/21/2022   PLT 270 09/21/2022   Lab Results  Component Value Date   CREATININE 1.13 (H) 09/21/2022   BUN 17 09/21/2022   NA 135 09/21/2022   K 3.6 09/21/2022   CL 102 09/21/2022   CO2 21 (L) 09/21/2022   Lab Results  Component Value Date   CHOL 242 (H) 09/21/2022   HDL 40 (L) 09/21/2022   LDLCALC 160 (H) 09/21/2022   LDLDIRECT 156.0 02/13/2020   TRIG 208 (H) 09/21/2022   CHOLHDL 6.1 09/21/2022    No results found for: "HGBA1C" Assessment & Plan    1.Hx of CAD: -s/p NSTEMI in 2014 with DES toD1 and DES to PL branch. -Today patient reports*** -Continue ASA 81 mg, labetalol  40 mg twice daily and Crestor  20 mg daily   2.  HTN: -Patient's blood pressure today was***     3.  Hyperlipidemia: -Patient's last LDL cholesterol was 101 in 2022 above goal of less than 70 - 4.  Diastolic CHF: -Patient's last 2D echo was completed in 2021 with EF of 55 to 60% with moderate LVH and grade 3 DD. She did have mild mitral valve regurgitation but no significant valve abnormalities  5.  Preoperative clearance:        Disposition: Follow-up with Teddie Favre, MD or APP in *** months {Are you ordering a CV Procedure (e.g. stress test, cath, DCCV, TEE, etc)?   Press F2        :[id number]}   Signed, Francene Ing, Retha Cast, NP 11/14/2023, 5:34 PM [facility]

## 2023-11-15 ENCOUNTER — Ambulatory Visit: Attending: Nurse Practitioner | Admitting: Nurse Practitioner

## 2023-11-15 DIAGNOSIS — I5032 Chronic diastolic (congestive) heart failure: Secondary | ICD-10-CM

## 2023-11-15 DIAGNOSIS — E782 Mixed hyperlipidemia: Secondary | ICD-10-CM

## 2023-11-15 DIAGNOSIS — I10 Essential (primary) hypertension: Secondary | ICD-10-CM

## 2023-11-15 DIAGNOSIS — Z01810 Encounter for preprocedural cardiovascular examination: Secondary | ICD-10-CM

## 2023-11-15 DIAGNOSIS — I25119 Atherosclerotic heart disease of native coronary artery with unspecified angina pectoris: Secondary | ICD-10-CM

## 2023-12-06 DIAGNOSIS — F410 Panic disorder [episodic paroxysmal anxiety] without agoraphobia: Secondary | ICD-10-CM | POA: Diagnosis not present

## 2023-12-06 DIAGNOSIS — F331 Major depressive disorder, recurrent, moderate: Secondary | ICD-10-CM | POA: Diagnosis not present

## 2023-12-06 DIAGNOSIS — I10 Essential (primary) hypertension: Secondary | ICD-10-CM | POA: Diagnosis not present

## 2023-12-06 DIAGNOSIS — E782 Mixed hyperlipidemia: Secondary | ICD-10-CM | POA: Diagnosis not present

## 2023-12-11 ENCOUNTER — Other Ambulatory Visit: Payer: Self-pay | Admitting: Cardiology

## 2023-12-20 DIAGNOSIS — E782 Mixed hyperlipidemia: Secondary | ICD-10-CM | POA: Diagnosis not present

## 2023-12-27 DIAGNOSIS — F331 Major depressive disorder, recurrent, moderate: Secondary | ICD-10-CM | POA: Diagnosis not present

## 2023-12-27 DIAGNOSIS — E1165 Type 2 diabetes mellitus with hyperglycemia: Secondary | ICD-10-CM | POA: Diagnosis not present

## 2023-12-27 DIAGNOSIS — M545 Low back pain, unspecified: Secondary | ICD-10-CM | POA: Diagnosis not present

## 2023-12-27 DIAGNOSIS — E782 Mixed hyperlipidemia: Secondary | ICD-10-CM | POA: Diagnosis not present

## 2023-12-27 DIAGNOSIS — M25512 Pain in left shoulder: Secondary | ICD-10-CM | POA: Diagnosis not present

## 2023-12-27 DIAGNOSIS — G8929 Other chronic pain: Secondary | ICD-10-CM | POA: Diagnosis not present

## 2024-01-03 ENCOUNTER — Other Ambulatory Visit: Payer: Self-pay | Admitting: Student

## 2024-01-03 ENCOUNTER — Other Ambulatory Visit: Payer: Self-pay | Admitting: Cardiology

## 2024-01-08 ENCOUNTER — Other Ambulatory Visit: Payer: Self-pay | Admitting: Cardiology

## 2024-01-18 ENCOUNTER — Ambulatory Visit: Attending: Cardiology | Admitting: Cardiology

## 2024-01-18 ENCOUNTER — Encounter: Payer: Self-pay | Admitting: Cardiology

## 2024-01-18 VITALS — BP 130/90 | HR 86 | Ht 63.0 in | Wt 271.2 lb

## 2024-01-18 DIAGNOSIS — I25119 Atherosclerotic heart disease of native coronary artery with unspecified angina pectoris: Secondary | ICD-10-CM

## 2024-01-18 DIAGNOSIS — Z794 Long term (current) use of insulin: Secondary | ICD-10-CM

## 2024-01-18 DIAGNOSIS — E782 Mixed hyperlipidemia: Secondary | ICD-10-CM

## 2024-01-18 DIAGNOSIS — R002 Palpitations: Secondary | ICD-10-CM

## 2024-01-18 DIAGNOSIS — Z01810 Encounter for preprocedural cardiovascular examination: Secondary | ICD-10-CM | POA: Diagnosis not present

## 2024-01-18 DIAGNOSIS — E118 Type 2 diabetes mellitus with unspecified complications: Secondary | ICD-10-CM

## 2024-01-18 DIAGNOSIS — I10 Essential (primary) hypertension: Secondary | ICD-10-CM

## 2024-01-18 MED ORDER — AMLODIPINE BESYLATE 5 MG PO TABS: 2.5000 mg | ORAL_TABLET | Freq: Every day | ORAL | 3 refills | Status: AC

## 2024-01-18 NOTE — Patient Instructions (Signed)
 Medication Instructions:  Your physician recommends the following medication changes.  START TAKING: Amlodipine 2.5 mg (1/2 tablet) daily  *If you need a refill on your cardiac medications before your next appointment, please call your pharmacy*  Lab Work: No labs ordered today  If you have labs (blood work) drawn today and your tests are completely normal, you will receive your results only by: MyChart Message (if you have MyChart) OR A paper copy in the mail If you have any lab test that is abnormal or we need to change your treatment, we will call you to review the results.  Testing/Procedures: No test ordered today   Follow-Up: At [facility], you and your health needs are our priority.  As part of our continuing mission to provide you with exceptional heart care, our providers are all part of one team.  This team includes your primary Cardiologist (physician) and Advanced Practice Providers or APPs (Physician Assistants and Nurse Practitioners) who all work together to provide you with the care you need, when you need it.  Your next appointment:   6 week(s)  Provider:   Ronald Cockayne, NP
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
## 2024-01-27 ENCOUNTER — Other Ambulatory Visit: Payer: Self-pay | Admitting: Student

## 2024-01-27 ENCOUNTER — Ambulatory Visit

## 2024-02-02 NOTE — Telephone Encounter (Signed)
 Preoperative cardiovascular examination for deep cleaning of her teeth.  Patient denies any anginal or anginal equivalents requires no further cardiac testing.  Ideally aspirin  should be continued without interruption however if bleeding risk is considered to great aspirin  may be held prior to her procedure and resumed postoperatively when felt safe from a bleeding standpoint.

## 2024-02-14 ENCOUNTER — Ambulatory Visit
Admission: RE | Admit: 2024-02-14 | Discharge: 2024-02-14 | Disposition: A | Discharge status: Home / Self Care | Source: Ambulatory Visit | Attending: Nurse Practitioner | Admitting: Nurse Practitioner

## 2024-02-14 ENCOUNTER — Other Ambulatory Visit: Payer: Self-pay

## 2024-02-14 ENCOUNTER — Ambulatory Visit: Admitting: Dietician

## 2024-02-14 VITALS — BP 141/97 | HR 74 | Temp 97.6°F | Resp 20

## 2024-02-14 DIAGNOSIS — R112 Nausea with vomiting, unspecified: Secondary | ICD-10-CM | POA: Diagnosis not present

## 2024-02-14 DIAGNOSIS — Z3202 Encounter for pregnancy test, result negative: Secondary | ICD-10-CM | POA: Diagnosis not present

## 2024-02-14 DIAGNOSIS — F909 Attention-deficit hyperactivity disorder, unspecified type: Secondary | ICD-10-CM | POA: Diagnosis not present

## 2024-02-14 DIAGNOSIS — I10 Essential (primary) hypertension: Secondary | ICD-10-CM | POA: Diagnosis not present

## 2024-02-14 DIAGNOSIS — I2510 Atherosclerotic heart disease of native coronary artery without angina pectoris: Secondary | ICD-10-CM | POA: Diagnosis not present

## 2024-02-14 DIAGNOSIS — R197 Diarrhea, unspecified: Secondary | ICD-10-CM

## 2024-02-14 DIAGNOSIS — F331 Major depressive disorder, recurrent, moderate: Secondary | ICD-10-CM | POA: Diagnosis not present

## 2024-02-14 DIAGNOSIS — R1084 Generalized abdominal pain: Secondary | ICD-10-CM | POA: Diagnosis not present

## 2024-02-14 LAB — POCT URINALYSIS DIP (MANUAL ENTRY)
Bilirubin, UA: NEGATIVE
Blood, UA: NEGATIVE
Glucose, UA: 500 mg/dL — AB
Ketones, POC UA: NEGATIVE mg/dL
Leukocytes, UA: NEGATIVE
Nitrite, UA: NEGATIVE
Protein Ur, POC: NEGATIVE mg/dL
Spec Grav, UA: 1.02 (ref 1.010–1.025)
Urobilinogen, UA: 1 U/dL
pH, UA: 6 (ref 5.0–8.0)

## 2024-02-14 LAB — POCT URINE PREGNANCY: Preg Test, Ur: NEGATIVE

## 2024-02-14 MED ORDER — ONDANSETRON 4 MG PO TBDP
4.0000 mg | ORAL_TABLET | Freq: Once | ORAL | Status: AC
  Administered 2024-02-14: 4 mg via ORAL

## 2024-02-14 MED ORDER — ONDANSETRON 4 MG PO TBDP: 4.0000 mg | ORAL_TABLET | Freq: Three times a day (TID) | ORAL | 0 refills | Status: AC | PRN

## 2024-02-14 NOTE — Discharge Instructions (Addendum)
 As we discussed, your symptoms are consistent with a viral stomach bug.  Symptoms should improve over the next few days.  We gave you Zofran  in urgent care today, you can continue this at home every 8 hours as needed for nausea/vomiting.  Please push hydration plenty fluids-water and Pedialyte are best.  If you like eating, recommend bananas, rice, applesauce, and dry toast.  Seek care if you develop blood in the vomit or stool or severe pain in your abdomen.

## 2024-02-14 NOTE — ED Notes (Signed)
 Pt tolerated fluids well. Asked for more water

## 2024-02-14 NOTE — ED Triage Notes (Addendum)
 Pt reports body aches since Tuesday, last night started having cold sweats nausea, diarrhea, emesis. Pt reports also needs work note.  Pt declined covid/flu test

## 2024-02-14 NOTE — ED Provider Notes (Signed)
 [facility]    CSN: [id number] Arrival date & time: 02/14/24  1039      History   Chief Complaint Chief Complaint  Patient presents with   Diarrhea    VomitingBody aches - Entered by patient    HPI Chelsea Petty is a 38 y.o. female.   Patient presents today for 2 day history of body aches, chills, diarrhea, generalized abdominal pain, and nausea/vomiting.  She endorses 2 episodes of diarrhea and 1 episode of vomiting today; no blood in the stool or vomit.  She endorses current nausea and has not tried to eat or drink anything today so far.  No loss of taste or smell, recent foreign travel, or ingestion of suspicious drinking water.  Denies eating undercooked food or eating takeout recently.  Denies recent antibiotic use.  She also endorses headache and has taken Tylenol  which seems to help with the headache.  Reports multiple coworkers have been sick at work with similar symptoms.    Past Medical History:  Diagnosis Date   Coronary artery disease    a. s/p NSTEMI in 2014 with DES to D1 and DES to PL branch   Diabetes mellitus without complication (HCC)    Essential hypertension    Hyperlipidemia     Patient Active Problem List   Diagnosis Date Noted   Elevated troponin    Acute on chronic diastolic HF (heart failure) (HCC)    Chest pain 02/24/2020   Hypertensive urgency 02/24/2020   Pulmonary edema 02/24/2020   Essential hypertension 07/28/2015   Amenorrhea 03/10/2015   Tobacco abuse 09/30/2013   CAD (coronary artery disease) 12/05/2012   Palpitations 12/05/2012   Malignant hypertension 11/12/2012   Obesity 11/12/2012    Past Surgical History:  Procedure Laterality Date   CORONARY STENT PLACEMENT      OB History     Gravida  0   Para  0   Term  0   Preterm  0   AB  0   Living  0      SAB  0   IAB  0   Ectopic  0   Multiple  0   Live Births  0            Home Medications    Prior to Admission medications    Medication Sig Start Date End Date Taking? Authorizing Provider  ondansetron  (ZOFRAN -ODT) 4 MG disintegrating tablet Take 1 tablet (4 mg total) by mouth every 8 (eight) hours as needed for nausea or vomiting. 02/14/24  Yes Chandra Harlene A, NP  amLODipine  (NORVASC ) 5 MG tablet Take 0.5 tablets (2.5 mg total) by mouth daily. 01/18/24 01/17/25  Gerard Frederick, NP  aspirin  EC 81 MG tablet Take 1 tablet (81 mg total) by mouth daily. Swallow whole. 03/25/20   Strader, Laymon CHRISTELLA, PA-C  chlorthalidone  (HYGROTON ) 25 MG tablet TAKE 1 TABLET(25 MG) BY MOUTH DAILY 01/29/24   Debera Jayson MATSU, MD  FLUoxetine  (PROZAC ) 20 MG capsule Take 1 capsule (20 mg total) by mouth daily. 02/13/20   Corey, Evan S, MD  labetalol  (NORMODYNE ) 200 MG tablet TAKE 1 TABLET(200 MG) BY MOUTH TWICE DAILY 01/03/24   Debera Jayson MATSU, MD  losartan  (COZAAR ) 100 MG tablet TAKE 1 TABLET BY MOUTH DAILY 01/08/24   Debera Jayson MATSU, MD  nitroGLYCERIN  (NITROSTAT ) 0.4 MG SL tablet Place 1 tablet (0.4 mg total) under the tongue every 5 (five) minutes as needed for chest pain. 09/21/22   Gerard Frederick, NP  rosuvastatin  (CRESTOR ) 40 MG tablet Take 1 tablet (40 mg total) by mouth daily. 09/22/22   Hammock, Tylene, NP  Semaglutide, 1 MG/DOSE, (OZEMPIC, 1 MG/DOSE,) 4 MG/3ML SOPN Inject 1 mg into the skin once a week. 12/06/23   [provider]  spironolactone  (ALDACTONE ) 50 MG tablet TAKE 1 TABLET(50 MG) BY MOUTH DAILY 01/08/24   McDowell, Samuel G, MD  tiZANidine (ZANAFLEX) 4 MG tablet Take 1 tablet by mouth 2 (two) times daily as needed. 03/30/22   [provider]  VRAYLAR 1.5 MG capsule Take 1 tablet by mouth daily. 12/27/23   [provider]    Family History Family History  Problem Relation Age of Onset   Diabetes Paternal Grandfather    Other Paternal Grandmother        low blood sugar   Other Maternal Grandmother        gallstones-septic   Other Maternal Grandfather        heart issues   Hypertension Father     Heart disease Father    Diabetes Father    Hypertension Mother    Cancer Mother        ovarian   Diabetes Brother        borderline    Social History Social History   Tobacco Use   Smoking status: Former    Current packs/day: 0.75    Average packs/day: 0.8 packs/day for 15.0 years (11.3 ttl pk-yrs)    Types: Cigarettes   Smokeless tobacco: Never  Vaping Use   Vaping status: Never Used  Substance Use Topics   Alcohol use: No   Drug use: No     Allergies   Patient has no known allergies.   Review of Systems Review of Systems Per HPI  Physical Exam Triage Vital Signs ED Triage Vitals  Encounter Vitals Group     BP 02/14/24 1051 (!) 141/97     Girls Systolic BP Percentile --      Girls Diastolic BP Percentile --      Boys Systolic BP Percentile --      Boys Diastolic BP Percentile --      Pulse Rate 02/14/24 1051 74     Resp 02/14/24 1051 20     Temp 02/14/24 1051 97.6 F (36.4 C)     Temp Source 02/14/24 1051 Oral     SpO2 02/14/24 1051 97 %     Weight --      Height --      Head Circumference --      Peak Flow --      Pain Score 02/14/24 1049 2     Pain Loc --      Pain Education --      Exclude from Growth Chart --    No data found.  Updated Vital Signs BP (!) 141/97 (BP Location: Right Arm)   Pulse 74   Temp 97.6 F (36.4 C) (Oral)   Resp 20   LMP 01/18/2024 (Approximate)   SpO2 97%   Visual Acuity Right Eye Distance:   Left Eye Distance:   Bilateral Distance:    Right Eye Near:   Left Eye Near:    Bilateral Near:     Physical Exam Vitals and nursing note reviewed.  Constitutional:      General: She is not in acute distress.    Appearance: Normal appearance. She is not toxic-appearing.  HENT:     Right Ear: Tympanic membrane, ear canal and external ear normal. There  is no impacted cerumen.     Left Ear: Tympanic membrane, ear canal and external ear normal. There is no impacted cerumen.     Mouth/Throat:     Mouth: Mucous membranes  are moist.     Pharynx: Oropharynx is clear. No posterior oropharyngeal erythema.   Cardiovascular:     Rate and Rhythm: Normal rate and regular rhythm.  Pulmonary:     Effort: Pulmonary effort is normal. No respiratory distress.     Breath sounds: Normal breath sounds. No wheezing, rhonchi or rales.  Abdominal:     General: Abdomen is flat. Bowel sounds are normal. There is no distension.     Palpations: Abdomen is soft.     Tenderness: There is no abdominal tenderness. There is no right CVA tenderness, left CVA tenderness or guarding.   Musculoskeletal:     Cervical back: Normal range of motion.  Lymphadenopathy:     Cervical: No cervical adenopathy.   Skin:    General: Skin is warm and dry.     Capillary Refill: Capillary refill takes less than 2 seconds.     Coloration: Skin is not jaundiced or pale.     Findings: No erythema.   Neurological:     Mental Status: She is alert and oriented to person, place, and time.   Psychiatric:        Behavior: Behavior is cooperative.      UC Treatments / Results  Labs (all labs ordered are listed, but only abnormal results are displayed) Labs Reviewed  POCT URINALYSIS DIP (MANUAL ENTRY) - Abnormal; Notable for the following components:      Result Value   Glucose, UA =500 (*)    All other components within normal limits  POCT URINE PREGNANCY    EKG   Radiology No results found.  Procedures Procedures (including critical care time)  Medications Ordered in UC Medications  ondansetron  (ZOFRAN -ODT) disintegrating tablet 4 mg (4 mg Oral Given 02/14/24 1117)    Initial Impression / Assessment and Plan / UC Course  I have reviewed the triage vital signs and the nursing notes.  Pertinent labs & imaging results that were available during my care of the patient were reviewed by me and considered in my medical decision making (see chart for details).   Patient is well-appearing, normotensive, afebrile, not tachycardic, not  tachypneic, oxygenating well on room air.    1. Nausea vomiting and diarrhea 2. Generalized abdominal pain 3. Urine pregnancy test negative Vitals and exam are reassuring today Suspect most likely viral gastroenteritis UA is negative for ketones or signs of dehydration Zofran  4 mg ODT was given in urgent care today and patient was able to tolerate water shortly after Will start patient on Zofran  at home every 8 hours as needed for nausea/vomiting, brat diet, push fluids Strict ER precautions discussed with patient  The patient was given the opportunity to ask questions.  All questions answered to their satisfaction.  The patient is in agreement to this plan.   Final Clinical Impressions(s) / UC Diagnoses   Final diagnoses:  Nausea vomiting and diarrhea  Generalized abdominal pain  Urine pregnancy test negative     Discharge Instructions      As we discussed, your symptoms are consistent with a viral stomach bug.  Symptoms should improve over the next few days.  We gave you Zofran  in urgent care today, you can continue this at home every 8 hours as needed for nausea/vomiting.  Please push hydration plenty  fluids-water and Pedialyte are best.  If you like eating, recommend bananas, rice, applesauce, and dry toast.  Seek care if you develop blood in the vomit or stool or severe pain in your abdomen.     ED Prescriptions     Medication Sig Dispense Auth. Provider   ondansetron  (ZOFRAN -ODT) 4 MG disintegrating tablet Take 1 tablet (4 mg total) by mouth every 8 (eight) hours as needed for nausea or vomiting. 20 tablet Chandra Harlene LABOR, NP      PDMP not reviewed this encounter.   Chandra Harlene LABOR, NP 02/14/24 1141

## 2024-02-28 ENCOUNTER — Ambulatory Visit: Attending: Cardiology | Admitting: Cardiology
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
## 2024-03-29 DIAGNOSIS — R112 Nausea with vomiting, unspecified: Secondary | ICD-10-CM | POA: Diagnosis not present

## 2024-03-29 DIAGNOSIS — R197 Diarrhea, unspecified: Secondary | ICD-10-CM | POA: Diagnosis not present

## 2024-04-02 DIAGNOSIS — R519 Headache, unspecified: Secondary | ICD-10-CM | POA: Diagnosis not present

## 2024-04-02 DIAGNOSIS — K529 Noninfective gastroenteritis and colitis, unspecified: Secondary | ICD-10-CM | POA: Diagnosis not present

## 2024-04-02 DIAGNOSIS — M7918 Myalgia, other site: Secondary | ICD-10-CM | POA: Diagnosis not present

## 2024-04-11 DIAGNOSIS — E1165 Type 2 diabetes mellitus with hyperglycemia: Secondary | ICD-10-CM | POA: Diagnosis not present

## 2024-04-11 DIAGNOSIS — I10 Essential (primary) hypertension: Secondary | ICD-10-CM | POA: Diagnosis not present

## 2024-04-14 ENCOUNTER — Other Ambulatory Visit: Payer: Self-pay | Admitting: Cardiology

## 2024-05-13 ENCOUNTER — Other Ambulatory Visit (HOSPITAL_COMMUNITY): Payer: Self-pay | Admitting: Family Medicine

## 2024-05-13 DIAGNOSIS — M25512 Pain in left shoulder: Secondary | ICD-10-CM

## 2024-05-17 DIAGNOSIS — M25512 Pain in left shoulder: Secondary | ICD-10-CM | POA: Diagnosis not present

## 2024-05-22 ENCOUNTER — Encounter (HOSPITAL_COMMUNITY): Payer: Self-pay

## 2024-05-22 ENCOUNTER — Ambulatory Visit (HOSPITAL_COMMUNITY)

## 2024-05-22 DIAGNOSIS — E1165 Type 2 diabetes mellitus with hyperglycemia: Secondary | ICD-10-CM | POA: Diagnosis not present

## 2024-05-22 DIAGNOSIS — I2510 Atherosclerotic heart disease of native coronary artery without angina pectoris: Secondary | ICD-10-CM | POA: Diagnosis not present

## 2024-05-22 DIAGNOSIS — D72829 Elevated white blood cell count, unspecified: Secondary | ICD-10-CM | POA: Diagnosis not present

## 2024-05-22 DIAGNOSIS — I10 Essential (primary) hypertension: Secondary | ICD-10-CM | POA: Diagnosis not present

## 2024-05-22 DIAGNOSIS — E782 Mixed hyperlipidemia: Secondary | ICD-10-CM | POA: Diagnosis not present

## 2024-05-31 ENCOUNTER — Other Ambulatory Visit: Payer: Self-pay | Admitting: Cardiology

## 2024-06-13 DIAGNOSIS — Z124 Encounter for screening for malignant neoplasm of cervix: Secondary | ICD-10-CM | POA: Diagnosis not present

## 2024-06-13 DIAGNOSIS — Z79899 Other long term (current) drug therapy: Secondary | ICD-10-CM | POA: Diagnosis not present

## 2024-06-13 DIAGNOSIS — I10 Essential (primary) hypertension: Secondary | ICD-10-CM | POA: Diagnosis not present

## 2024-06-13 DIAGNOSIS — F4381 Prolonged grief disorder: Secondary | ICD-10-CM | POA: Diagnosis not present

## 2024-07-23 DIAGNOSIS — R112 Nausea with vomiting, unspecified: Secondary | ICD-10-CM | POA: Diagnosis not present

## 2024-07-23 DIAGNOSIS — Z20822 Contact with and (suspected) exposure to covid-19: Secondary | ICD-10-CM | POA: Diagnosis not present

## 2024-07-23 DIAGNOSIS — A084 Viral intestinal infection, unspecified: Secondary | ICD-10-CM | POA: Diagnosis not present

## 2024-09-26 ENCOUNTER — Encounter (INDEPENDENT_AMBULATORY_CARE_PROVIDER_SITE_OTHER): Payer: Self-pay
